# Patient Record
Sex: Male | Born: 1990 | Race: White | Hispanic: No | Marital: Single | Smoking: Current some day smoker
Health system: Southern US, Community
[De-identification: ages and names within clinical notes are randomized; demographics above are authoritative.]

---
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
## 2014-06-29 ENCOUNTER — Ambulatory Visit
Admission: RE | Admit: 2014-06-29 | Discharge: 2014-06-29 | Disposition: A | Discharge status: Home / Self Care | Payer: BLUE CROSS/BLUE SHIELD | Source: Ambulatory Visit | Attending: Nurse Practitioner | Admitting: Nurse Practitioner

## 2014-06-29 ENCOUNTER — Other Ambulatory Visit: Payer: Self-pay | Admitting: Nurse Practitioner

## 2014-06-29 DIAGNOSIS — M545 Low back pain: Secondary | ICD-10-CM

## 2018-11-15 ENCOUNTER — Other Ambulatory Visit: Payer: Self-pay

## 2018-11-15 ENCOUNTER — Inpatient Hospital Stay: Payer: BC Managed Care – PPO | Admitting: Anesthesiology

## 2018-11-15 ENCOUNTER — Inpatient Hospital Stay
Admission: EM | Admit: 2018-11-15 | Discharge: 2018-11-17 | DRG: 137 | Disposition: A | Discharge status: Home / Self Care | Payer: BC Managed Care – PPO | Attending: Internal Medicine | Admitting: Internal Medicine

## 2018-11-15 ENCOUNTER — Emergency Department: Payer: BC Managed Care – PPO

## 2018-11-15 ENCOUNTER — Encounter: Payer: Self-pay | Admitting: Radiology

## 2018-11-15 ENCOUNTER — Encounter
Admission: EM | Disposition: A | Discharge status: Home / Self Care | Payer: Self-pay | Source: Home / Self Care | Attending: Internal Medicine

## 2018-11-15 DIAGNOSIS — F17210 Nicotine dependence, cigarettes, uncomplicated: Secondary | ICD-10-CM | POA: Diagnosis present

## 2018-11-15 DIAGNOSIS — R220 Localized swelling, mass and lump, head: Secondary | ICD-10-CM

## 2018-11-15 DIAGNOSIS — K122 Cellulitis and abscess of mouth: Secondary | ICD-10-CM | POA: Diagnosis present

## 2018-11-15 DIAGNOSIS — K029 Dental caries, unspecified: Secondary | ICD-10-CM | POA: Diagnosis present

## 2018-11-15 DIAGNOSIS — Z20828 Contact with and (suspected) exposure to other viral communicable diseases: Secondary | ICD-10-CM | POA: Diagnosis present

## 2018-11-15 DIAGNOSIS — R739 Hyperglycemia, unspecified: Secondary | ICD-10-CM | POA: Diagnosis not present

## 2018-11-15 DIAGNOSIS — T380X5A Adverse effect of glucocorticoids and synthetic analogues, initial encounter: Secondary | ICD-10-CM | POA: Diagnosis not present

## 2018-11-15 DIAGNOSIS — R030 Elevated blood-pressure reading, without diagnosis of hypertension: Secondary | ICD-10-CM | POA: Diagnosis present

## 2018-11-15 DIAGNOSIS — K047 Periapical abscess without sinus: Principal | ICD-10-CM | POA: Diagnosis present

## 2018-11-15 HISTORY — PX: INCISION AND DRAINAGE ABSCESS: SHX5864

## 2018-11-15 LAB — COMPREHENSIVE METABOLIC PANEL
ALT: 13 U/L (ref 0–44)
AST: 14 U/L — ABNORMAL LOW (ref 15–41)
Albumin: 4.2 g/dL (ref 3.5–5.0)
Alkaline Phosphatase: 57 U/L (ref 38–126)
Anion gap: 14 (ref 5–15)
BUN: 14 mg/dL (ref 6–20)
CO2: 22 mmol/L (ref 22–32)
Calcium: 9.3 mg/dL (ref 8.9–10.3)
Chloride: 100 mmol/L (ref 98–111)
Creatinine, Ser: 0.96 mg/dL (ref 0.61–1.24)
GFR calc Af Amer: >60 mL/min (ref >60)
GFR calc non Af Amer: >60 mL/min (ref >60)
Glucose, Bld: 99 mg/dL (ref 70–99)
Potassium: 4 mmol/L (ref 3.5–5.1)
Sodium: 136 mmol/L (ref 135–145)
Total Bilirubin: 0.9 mg/dL (ref 0.3–1.2)
Total Protein: 8.4 g/dL — ABNORMAL HIGH (ref 6.5–8.1)

## 2018-11-15 LAB — CBC WITH DIFFERENTIAL/PLATELET
Abs Immature Granulocytes: 0.03 10*3/uL (ref 0.00–0.07)
Basophils Absolute: 0.1 10*3/uL (ref 0.0–0.1)
Basophils Relative: 1 %
Eosinophils Absolute: 0.2 10*3/uL (ref 0.0–0.5)
Eosinophils Relative: 1 %
HCT: 40.6 % (ref 39.0–52.0)
Hemoglobin: 13.9 g/dL (ref 13.0–17.0)
Immature Granulocytes: 0 %
Lymphocytes Relative: 11 %
Lymphs Abs: 1.2 10*3/uL (ref 0.7–4.0)
MCH: 31.4 pg (ref 26.0–34.0)
MCHC: 34.2 g/dL (ref 30.0–36.0)
MCV: 91.6 fL (ref 80.0–100.0)
Monocytes Absolute: 1.5 10*3/uL — ABNORMAL HIGH (ref 0.1–1.0)
Monocytes Relative: 13 %
Neutro Abs: 8.2 10*3/uL — ABNORMAL HIGH (ref 1.7–7.7)
Neutrophils Relative %: 74 %
Platelets: 341 10*3/uL (ref 150–400)
RBC: 4.43 MIL/uL (ref 4.22–5.81)
RDW: 11.9 % (ref 11.5–15.5)
WBC: 11.2 10*3/uL — ABNORMAL HIGH (ref 4.0–10.5)
nRBC: 0 % (ref 0.0–0.2)

## 2018-11-15 LAB — TYPE AND SCREEN
ABO/RH(D): O POS
Antibody Screen: NEGATIVE

## 2018-11-15 LAB — LACTIC ACID, PLASMA: Lactic Acid, Venous: 0.8 mmol/L (ref 0.5–1.9)

## 2018-11-15 LAB — SARS CORONAVIRUS 2 BY RT PCR (HOSPITAL ORDER, PERFORMED IN ~~LOC~~ HOSPITAL LAB): SARS Coronavirus 2: NEGATIVE

## 2018-11-15 SURGERY — INCISION AND DRAINAGE, ABSCESS
Anesthesia: General

## 2018-11-15 MED ORDER — PROPOFOL 10 MG/ML IV BOLUS
INTRAVENOUS | Status: AC
  Filled 2018-11-15: qty 20

## 2018-11-15 MED ORDER — LIDOCAINE HCL (PF) 2 % IJ SOLN
INTRAMUSCULAR | Status: AC
  Filled 2018-11-15: qty 10

## 2018-11-15 MED ORDER — KETOROLAC TROMETHAMINE 30 MG/ML IJ SOLN: 30.0000 mg | Freq: Four times a day (QID) | INTRAMUSCULAR | Status: DC | PRN

## 2018-11-15 MED ORDER — ONDANSETRON HCL 4 MG/2ML IJ SOLN
4.0000 mg | Freq: Once | INTRAMUSCULAR | Status: AC
  Administered 2018-11-15: 22:00:00 4 mg via INTRAVENOUS
  Filled 2018-11-15: qty 2

## 2018-11-15 MED ORDER — ROCURONIUM BROMIDE 50 MG/5ML IV SOLN
INTRAVENOUS | Status: AC
  Filled 2018-11-15: qty 1

## 2018-11-15 MED ORDER — LIDOCAINE HCL (CARDIAC) PF 100 MG/5ML IV SOSY
PREFILLED_SYRINGE | INTRAVENOUS | Status: DC | PRN
  Administered 2018-11-15: 100 mg via INTRATRACHEAL

## 2018-11-15 MED ORDER — DEXAMETHASONE SODIUM PHOSPHATE 10 MG/ML IJ SOLN
10.0000 mg | Freq: Once | INTRAMUSCULAR | Status: AC
  Administered 2018-11-15: 10 mg via INTRAVENOUS
  Filled 2018-11-15: qty 1

## 2018-11-15 MED ORDER — MIDAZOLAM HCL 2 MG/2ML IJ SOLN
INTRAMUSCULAR | Status: AC
  Filled 2018-11-15: qty 2

## 2018-11-15 MED ORDER — FENTANYL CITRATE (PF) 100 MCG/2ML IJ SOLN
INTRAMUSCULAR | Status: AC
  Filled 2018-11-15: qty 2

## 2018-11-15 MED ORDER — SODIUM CHLORIDE 0.9 % IV SOLN
INTRAVENOUS | Status: DC
  Administered 2018-11-16 – 2018-11-17 (×3): via INTRAVENOUS

## 2018-11-15 MED ORDER — LACTATED RINGERS IV SOLN
INTRAVENOUS | Status: DC | PRN
  Administered 2018-11-15: 23:00:00 via INTRAVENOUS

## 2018-11-15 MED ORDER — CLINDAMYCIN PHOSPHATE 900 MG/50ML IV SOLN
900.0000 mg | Freq: Once | INTRAVENOUS | Status: AC
  Administered 2018-11-15: 900 mg via INTRAVENOUS
  Filled 2018-11-15: qty 50

## 2018-11-15 MED ORDER — DEXAMETHASONE SODIUM PHOSPHATE 10 MG/ML IJ SOLN
INTRAMUSCULAR | Status: AC
  Filled 2018-11-15: qty 1

## 2018-11-15 MED ORDER — MIDAZOLAM HCL 2 MG/2ML IJ SOLN
INTRAMUSCULAR | Status: DC | PRN
  Administered 2018-11-15: 2 mg via INTRAVENOUS

## 2018-11-15 MED ORDER — DEXAMETHASONE SODIUM PHOSPHATE 10 MG/ML IJ SOLN
INTRAMUSCULAR | Status: DC | PRN
  Administered 2018-11-15: 10 mg via INTRAVENOUS

## 2018-11-15 MED ORDER — ACETAMINOPHEN 650 MG RE SUPP: 650.0000 mg | Freq: Four times a day (QID) | RECTAL | Status: DC | PRN

## 2018-11-15 MED ORDER — FENTANYL CITRATE (PF) 100 MCG/2ML IJ SOLN
INTRAMUSCULAR | Status: DC | PRN
  Administered 2018-11-15 – 2018-11-16 (×2): 50 ug via INTRAVENOUS

## 2018-11-15 MED ORDER — ONDANSETRON HCL 4 MG/2ML IJ SOLN
INTRAMUSCULAR | Status: DC | PRN
  Administered 2018-11-15: 4 mg via INTRAVENOUS

## 2018-11-15 MED ORDER — ONDANSETRON HCL 4 MG/2ML IJ SOLN
INTRAMUSCULAR | Status: AC
  Filled 2018-11-15: qty 2

## 2018-11-15 MED ORDER — SUCCINYLCHOLINE CHLORIDE 20 MG/ML IJ SOLN
INTRAMUSCULAR | Status: AC
  Filled 2018-11-15: qty 1

## 2018-11-15 MED ORDER — ACETAMINOPHEN 325 MG PO TABS
650.0000 mg | ORAL_TABLET | Freq: Four times a day (QID) | ORAL | Status: DC | PRN
  Administered 2018-11-17: 650 mg via ORAL
  Filled 2018-11-15: qty 2

## 2018-11-15 MED ORDER — SODIUM CHLORIDE 0.9 % IV BOLUS
1000.0000 mL | Freq: Once | INTRAVENOUS | Status: AC
  Administered 2018-11-15: 1000 mL via INTRAVENOUS

## 2018-11-15 MED ORDER — PROPOFOL 10 MG/ML IV BOLUS
INTRAVENOUS | Status: DC | PRN
  Administered 2018-11-15: 50 mg via INTRAVENOUS
  Administered 2018-11-15: 150 mg via INTRAVENOUS

## 2018-11-15 MED ORDER — MORPHINE SULFATE (PF) 4 MG/ML IV SOLN
6.0000 mg | Freq: Once | INTRAVENOUS | Status: AC
  Administered 2018-11-15: 22:00:00 6 mg via INTRAVENOUS
  Filled 2018-11-15: qty 2

## 2018-11-15 MED ORDER — SUCCINYLCHOLINE CHLORIDE 20 MG/ML IJ SOLN
INTRAMUSCULAR | Status: DC | PRN
  Administered 2018-11-15: 100 mg via INTRAVENOUS

## 2018-11-15 MED ORDER — IOHEXOL 300 MG/ML  SOLN
75.0000 mL | Freq: Once | INTRAMUSCULAR | Status: AC | PRN
  Administered 2018-11-15: 20:00:00 75 mL via INTRAVENOUS

## 2018-11-15 SURGICAL SUPPLY — 9 items
CANISTER SUCT 1200ML W/VALVE (MISCELLANEOUS) ×3 IMPLANT
COVER WAND RF STERILE (DRAPES) ×3 IMPLANT
ELECT REM PT RETURN 9FT ADLT (ELECTROSURGICAL) ×3
ELECTRODE REM PT RTRN 9FT ADLT (ELECTROSURGICAL) ×1 IMPLANT
GLOVE BIO SURGEON STRL SZ7.5 (GLOVE) ×3 IMPLANT
GOWN STRL REUS W/ TWL LRG LVL3 (GOWN DISPOSABLE) ×2 IMPLANT
GOWN STRL REUS W/TWL LRG LVL3 (GOWN DISPOSABLE) ×4
NS IRRIG 500ML POUR BTL (IV SOLUTION) ×3 IMPLANT
PACK HEAD/NECK (MISCELLANEOUS) ×3 IMPLANT

## 2018-11-15 NOTE — Anesthesia Procedure Notes (Signed)
Procedure Name: Intubation Date/Time: 11/15/2018 11:32 PM Performed by: Clinton Sawyer, CRNA Pre-anesthesia Checklist: Patient identified, Emergency Drugs available, Suction available, Patient being monitored and Timeout performed Patient Re-evaluated:Patient Re-evaluated prior to induction Oxygen Delivery Method: Circle system utilized Preoxygenation: Pre-oxygenation with 100% oxygen Induction Type: IV induction Laryngoscope Size: McGraph and 4 Grade View: Grade I Tube type: Oral Tube size: 7.0 mm Number of attempts: 1 Airway Equipment and Method: Stylet and Video-laryngoscopy Placement Confirmation: ETT inserted through vocal cords under direct vision,  positive ETCO2 and breath sounds checked- equal and bilateral Secured at: 22 cm Tube secured with: Tape Dental Injury: Teeth and Oropharynx as per pre-operative assessment

## 2018-11-15 NOTE — ED Provider Notes (Addendum)
[facility] Emergency Department Provider Note  ____________________________________________   First MD Initiated Contact with Patient 11/15/18 2131     (approximate)  I have reviewed the triage vital signs and the nursing notes.   HISTORY  Chief Complaint Facial Swelling    HPI Trevor Tucker is a 28 y.o. male here with sublingual pain and swelling.  The patient states that over the last 24 to 48 hours, he is developed progressively worsening right facial and dental pain.  He states his symptoms started actually 3 days ago with mild right lower molar pain.  This then resolved.  He felt fine per day.  Over the last 24 hours, his pain has returned and he is developed severe worsening pain, fever, chills, and difficulty swallowing.  He is having difficulty tolerating his secretions.  Denies any shortness of breath.  States he has exquisite pain that is worse with any kind movement of his jaw or palpation.  No leaving factors.  No drainage.  He has poor dentition, and admits to not seeing dentist in years.  No other complaints.  He is not immunosuppressed.        History reviewed. No pertinent past medical history.  Patient Active Problem List   Diagnosis Date Noted  . Sublingual abscess 11/15/2018      Prior to Admission medications   Medication Sig Start Date End Date Taking? Authorizing Provider  clindamycin (CLEOCIN) 300 MG capsule Take 1 capsule (300 mg total) by mouth 4 (four) times daily. 11/17/18   Mayo, Allyn Kenner, MD    Allergies Patient has no known allergies.  History reviewed. No pertinent family history.  Social History Social History   Tobacco Use  . Smoking status: Current Some Day Smoker    Types: Cigarettes  . Smokeless tobacco: Current User    Types: Snuff, Chew  Substance Use Topics  . Alcohol use: Not on file  . Drug use: Not on file    Review of Systems  Review of Systems  Constitutional: Positive for fatigue.  Negative for chills and fever.  HENT: Positive for dental problem and sore throat.   Respiratory: Negative for shortness of breath.   Cardiovascular: Negative for chest pain.  Gastrointestinal: Negative for abdominal pain.  Genitourinary: Negative for flank pain.  Musculoskeletal: Negative for neck pain.  Skin: Negative for rash and wound.  Allergic/Immunologic: Negative for immunocompromised state.  Neurological: Positive for weakness. Negative for numbness.  Hematological: Does not bruise/bleed easily.  All other systems reviewed and are negative.    ____________________________________________  PHYSICAL EXAM:      VITAL SIGNS: ED Triage Vitals  Enc Vitals Group     BP 11/15/18 1851 (!) 146/83     Pulse Rate 11/15/18 1851 89     Resp 11/15/18 1851 18     Temp 11/15/18 1851 (!) 100.4 F (38 C)     Temp Source 11/15/18 1851 Oral     SpO2 11/15/18 1851 97 %     Weight 11/15/18 1851 153 lb (69.4 kg)     Height 11/15/18 1851 6\' 1"  (1.854 m)     Head Circumference --      Peak Flow --      Pain Score 11/15/18 1856 7     Pain Loc --      Pain Edu? --      Excl. in GC? --      Physical Exam Vitals signs and nursing note reviewed.  Constitutional:  General: He is not in acute distress.    Appearance: He is well-developed.  HENT:     Head: Normocephalic and atraumatic.     Comments: Diffuse poor dentition. Marked edema and swelling with tenderness R mandibular molar, with swelling extending to face. No sublingual swelling, no induration, no woody edema.  Eyes:     Conjunctiva/sclera: Conjunctivae normal.  Neck:     Musculoskeletal: Neck supple.     Comments: No stridor. Cardiovascular:     Rate and Rhythm: Normal rate and regular rhythm.     Heart sounds: Normal heart sounds. No murmur. No friction rub.  Pulmonary:     Effort: Pulmonary effort is normal. No respiratory distress.     Breath sounds: Normal breath sounds. No wheezing or rales.  Abdominal:      General: There is no distension.     Palpations: Abdomen is soft.     Tenderness: There is no abdominal tenderness.  Skin:    General: Skin is warm.     Capillary Refill: Capillary refill takes less than 2 seconds.  Neurological:     Mental Status: He is alert and oriented to person, place, and time.     Motor: No abnormal muscle tone.       ____________________________________________   LABS (all labs ordered are listed, but only abnormal results are displayed)  Labs Reviewed  CBC WITH DIFFERENTIAL/PLATELET - Abnormal; Notable for the following components:      Result Value   WBC 11.2 (*)    Neutro Abs 8.2 (*)    Monocytes Absolute 1.5 (*)    All other components within normal limits  COMPREHENSIVE METABOLIC PANEL - Abnormal; Notable for the following components:   Total Protein 8.4 (*)    AST 14 (*)    All other components within normal limits  CBC - Abnormal; Notable for the following components:   RBC 4.17 (*)    HCT 38.5 (*)    All other components within normal limits  BASIC METABOLIC PANEL - Abnormal; Notable for the following components:   Glucose, Bld 158 (*)    All other components within normal limits  CBC - Abnormal; Notable for the following components:   WBC 12.0 (*)    RBC 4.00 (*)    Hemoglobin 12.3 (*)    HCT 36.5 (*)    All other components within normal limits  BASIC METABOLIC PANEL - Abnormal; Notable for the following components:   Glucose, Bld 127 (*)    All other components within normal limits  SARS CORONAVIRUS 2 (HOSPITAL ORDER, PERFORMED IN [facility])  LACTIC ACID, PLASMA  HIV ANTIBODY (ROUTINE TESTING W REFLEX)  TYPE AND SCREEN    ____________________________________________  EKG:  ________________________________________  RADIOLOGY All imaging, including plain films, CT scans, and ultrasounds, independently reviewed by me, and interpretations confirmed via formal radiology reads.  ED MD interpretation:   CT Neck:  Infected molar w/ sublingual abscess and submandibular infection  Official radiology report(s): No results found.  ____________________________________________  PROCEDURES   Procedure(s) performed (including Critical Care):  Procedures  ____________________________________________  INITIAL IMPRESSION / MDM / ASSESSMENT AND PLAN / ED COURSE  As part of my medical decision making, I reviewed the following data within the electronic MEDICAL RECORD NUMBER Notes from prior ED visits and  Controlled Substance Database      *Trevor Tucker was evaluated in Emergency Department on 12/01/2018 for the symptoms described in the history of present illness. He was  evaluated in the context of the global COVID-19 pandemic, which necessitated consideration that the patient might be at risk for infection with the SARS-CoV-2 virus that causes COVID-19. Institutional protocols and algorithms that pertain to the evaluation of patients at risk for COVID-19 are in a state of rapid change based on information released by regulatory bodies including the CDC and federal and state organizations. These policies and algorithms were followed during the patient's care in the ED.  Some ED evaluations and interventions may be delayed as a result of limited staffing during the pandemic.*      Medical Decision Making: 28 year old male here with right molar infection extending to the sublingual space with large abscess. Febrile here. IV Clinda, decadron given. Dr. Richardson Landry consulted, will go to OR for I&D. Appreciate his evaluation. Will admit to medicine. Airway intact, no signs of compromise at this time.   ____________________________________________  FINAL CLINICAL IMPRESSION(S) / ED DIAGNOSES  Final diagnoses:  Dental abscess  Facial swelling     MEDICATIONS GIVEN DURING THIS VISIT:  Medications  iohexol (OMNIPAQUE) 300 MG/ML solution 75 mL (75 mLs Intravenous Contrast Given 11/15/18 2006)  clindamycin  (CLEOCIN) IVPB 900 mg (0 mg Intravenous Stopped 11/15/18 2224)  sodium chloride 0.9 % bolus 1,000 mL (1,000 mLs Intravenous New Bag/Given 11/15/18 2144)  dexamethasone (DECADRON) injection 10 mg (10 mg Intravenous Given 11/15/18 2146)  morphine 4 MG/ML injection 6 mg (6 mg Intravenous Given 11/15/18 2149)  ondansetron (ZOFRAN) injection 4 mg (4 mg Intravenous Given 11/15/18 2147)  dexamethasone (DECADRON) injection 4 mg (4 mg Intravenous Given 11/16/18 2104)  fentaNYL (SUBLIMAZE) 100 MCG/2ML injection (has no administration in time range)  midazolam (VERSED) 2 MG/2ML injection (has no administration in time range)  propofol (DIPRIVAN) 10 mg/mL bolus/IV push (has no administration in time range)  succinylcholine (ANECTINE) 20 MG/ML injection (has no administration in time range)  lidocaine (XYLOCAINE) 2 % injection (has no administration in time range)  rocuronium (ZEMURON) 50 MG/5ML injection (has no administration in time range)  dexamethasone (DECADRON) 10 MG/ML injection (has no administration in time range)  ondansetron (ZOFRAN) 4 MG/2ML injection (has no administration in time range)     ED Discharge Orders         Ordered    clindamycin (CLEOCIN) 300 MG capsule  4 times daily     11/17/18 1012    Increase activity slowly     11/17/18 1013    Diet - low sodium heart healthy     11/17/18 1013           Note:  This document was prepared using Dragon voice recognition software and may include unintentional dictation errors.   Duffy Bruce, MD 11/15/18 5621    Duffy Bruce, MD 12/01/18 [phone]

## 2018-11-15 NOTE — Consult Note (Signed)
Trevor Tucker, Trevor Tucker [id number] [birth year] Sandi Mealy, MD  Reason for Consult: Dental abscess Requesting Physician: Shaune Pollack, MD Consulting Physician: Sandi Mealy, MD  HPI: This 28 y.o. year old male was admitted on 11/15/2018 for Abcess. In the right neck/sublingual region from an infected tooth with progressive swelling, fever, difficulty swallowing since Thursday. CT shows a 3 cm abscess. No difficulty breathing.  Medications: Tylenol, Ibuprofen prn No current facility-administered medications for this encounter.    No current outpatient medications on file.  . (Not in a hospital admission)   Allergies: No Known Allergies  PMH: History reviewed. No pertinent past medical history.  Fam Hx: No family history on file.  Soc Hx:  Social History   Socioeconomic History  . Marital status: Single    Spouse name: Not on file  . Number of children: Not on file  . Years of education: Not on file  . Highest education level: Not on file  Occupational History  . Not on file  Social Needs  . Financial resource strain: Not on file  . Food insecurity    Worry: Not on file    Inability: Not on file  . Transportation needs    Medical: Not on file    Non-medical: Not on file  Tobacco Use  . Smoking status: Not on file  Substance and Sexual Activity  . Alcohol use: Not on file  . Drug use: Not on file  . Sexual activity: Not on file  Lifestyle  . Physical activity    Days per week: Not on file    Minutes per session: Not on file  . Stress: Not on file  Relationships  . Social Musician on phone: Not on file    Gets together: Not on file    Attends religious service: Not on file    Active member of club or organization: Not on file    Attends meetings of clubs or organizations: Not on file    Relationship status: Not on file  . Intimate partner violence    Fear of current or ex partner: Not on file    Emotionally abused: Not on file    Physically  abused: Not on file    Forced sexual activity: Not on file  Other Topics Concern  . Not on file  Social History Narrative  . Not on file    PSH: None. Procedures since admission: No admission procedures for hospital encounter.  ROS: Review of systems normal other than 12 systems except per HPI. No exposure to COVID known. Minor cough from allergies.   PHYSICAL EXAM  Vitals: Blood pressure (!) 146/83, pulse 89, temperature (!) 100.4 F (38 C), temperature source Oral, resp. rate 18, height 6\' 1"  (1.854 m), weight 69.4 kg, SpO2 97 %.. General: Well-developed, Well-nourished in no acute distress Mood: Mood and affect well adjusted, pleasant and cooperative. Orientation: Grossly alert and oriented. Vocal Quality: No hoarseness. Communicates verbally. head and Face: NCAT. No facial asymmetry. No visible skin lesions. No significant facial scars. No tenderness with sinus percussion. Facial strength normal and symmetric. Ears: External ears with normal landmarks, no lesions. External auditory canals free of infection, cerumen impaction or lesions. Tympanic membranes intact with good landmarks and normal mobility on pneumatic otoscopy. No middle ear effusion. Hearing: Speech reception grossly normal. Nose: External nose normal with midline dorsum and no lesions or deformity. Nasal Cavity reveals essentially midline septum with normal inferior turbinates. No significant mucosal congestion or erythema. Nasal  secretions are minimal and clear. No polyps seen on anterior rhinoscopy. Oral Cavity/ Oropharynx: Lips are normal with no lesions. Teeth possible dental caries right. Swelling moderate of right FOM. Moderate Trismus. Posterior pharynx clear.  Indirect Laryngoscopy/Nasopharyngoscopy: Visualization of the larynx, hypopharynx and nasopharynx is not possible in this setting with routine examination. Neck: Supple with firm edema right submandibular region, tender to palpation Lymphatic: Cervical lymph  nodes swollen tender right neck, indiscernable from the submandibular gland on the right Respiratory: Normal respiratory effort without labored breathing. Cardiovascular: Carotid pulse shows regular rate and rhythm Neurologic: Cranial Nerves II through XII are grossly intact. Eyes: Gaze and Ocular Motility are grossly normal. PERRLA. No visible nystagmus.  MEDICAL DECISION MAKING: Data Review:  Results for orders placed or performed during the hospital encounter of 11/15/18 (from the past 48 hour(s))  CBC with Differential     Status: Abnormal   Collection Time: 11/15/18  7:04 PM  Result Value Ref Range   WBC 11.2 (H) 4.0 - 10.5 K/uL   RBC 4.43 4.22 - 5.81 MIL/uL   Hemoglobin 13.9 13.0 - 17.0 g/dL   HCT 40.6 39.0 - 52.0 %   MCV 91.6 80.0 - 100.0 fL   MCH 31.4 26.0 - 34.0 pg   MCHC 34.2 30.0 - 36.0 g/dL   RDW 11.9 11.5 - 15.5 %   Platelets 341 150 - 400 K/uL   nRBC 0.0 0.0 - 0.2 %   Neutrophils Relative % 74 %   Neutro Abs 8.2 (H) 1.7 - 7.7 K/uL   Lymphocytes Relative 11 %   Lymphs Abs 1.2 0.7 - 4.0 K/uL   Monocytes Relative 13 %   Monocytes Absolute 1.5 (H) 0.1 - 1.0 K/uL   Eosinophils Relative 1 %   Eosinophils Absolute 0.2 0.0 - 0.5 K/uL   Basophils Relative 1 %   Basophils Absolute 0.1 0.0 - 0.1 K/uL   Immature Granulocytes 0 %   Abs Immature Granulocytes 0.03 0.00 - 0.07 K/uL    Comment: Performed at [facility], [address] [place], [place] [address]  Comprehensive metabolic panel     Status: Abnormal   Collection Time: 11/15/18  7:04 PM  Result Value Ref Range   Sodium 136 135 - 145 mmol/L   Potassium 4.0 3.5 - 5.1 mmol/L   Chloride 100 98 - 111 mmol/L   CO2 22 22 - 32 mmol/L   Glucose, Bld 99 70 - 99 mg/dL   BUN 14 6 - 20 mg/dL   Creatinine, Ser 0.96 0.61 - 1.24 mg/dL   Calcium 9.3 8.9 - 10.3 mg/dL   Total Protein 8.4 (H) 6.5 - 8.1 g/dL   Albumin 4.2 3.5 - 5.0 g/dL   AST 14 (L) 15 - 41 U/L   ALT 13 0 - 44 U/L   Alkaline Phosphatase 57 38 - 126  U/L   Total Bilirubin 0.9 0.3 - 1.2 mg/dL   GFR calc non Af Amer >60 >60 mL/min   GFR calc Af Amer >60 >60 mL/min   Anion gap 14 5 - 15    Comment: Performed at [facility], [address] [place], [place] [address]  Lactic acid, plasma     Status: None   Collection Time: 11/15/18  7:04 PM  Result Value Ref Range   Lactic Acid, Venous 0.8 0.5 - 1.9 mmol/L    Comment: Performed at [facility], [address] [place], [place] [address]  Type and screen [facility]  Status: None (Preliminary result)   Collection Time: 11/15/18  9:56 PM  Result Value Ref Range   ABO/RH(D) PENDING    Antibody Screen PENDING    Sample Expiration      11/18/2018,2359 Performed at [facility], [address] [place], [place] [id number]   . Ct Soft Tissue Neck W Contrast  Result Date: 11/15/2018 CLINICAL DATA:  Right-sided jaw pain and swelling beginning 4 days ago. EXAM: CT NECK WITH CONTRAST TECHNIQUE: Multidetector CT imaging of the neck was performed using the standard protocol following the bolus administration of intravenous contrast. CONTRAST:  75mL OMNIPAQUE IOHEXOL 300 MG/ML  SOLN COMPARISON:  None. FINDINGS: Pharynx and larynx: No mucosal lesion is seen. There is marked swelling in the region of the right mandibular gingiva and the right sublingual space with a sublingual abscess measuring 3 cm in diameter. Salivary glands: Parotid and submandibular glands are normal. Thyroid: Normal Lymph nodes: Reactive nodal enlargement of the submandibular and level 2 nodes. No suppuration. Vascular: Normal Limited intracranial: Normal Visualized orbits: Normal Mastoids and visualized paranasal sinuses: Some opacification of the anterior ethmoid and frontal sinuses. Not completely imaged. Skeleton: Dental decay a and periodontal disease at teeth 1, 16 and 32. Disease at tooth 32 includes a fairly sizable periapical cyst and is probably the etiology of the right  maxillary gingival and sublingual inflammatory disease. Upper chest: Negative Other: None IMPRESSION: Dental and periodontal disease teeth 1, 16 and 32. Periapical abscess of the right mandibular wisdom tooth 32 probably responsible for pronounced right face inflammatory change with right gingival and sublingual abscess measuring up to 3 cm in diameter. Reactive regional nodal enlargement without suppuration. Electronically Signed   By: Paulina FusiMark  Shogry M.D.   On: 11/15/2018 20:27  .   ASSESSMENT: Right sublingual abscess due to dental infection  PLAN: I&D of abscess with post-op admission to medicine for IV antibiotics. Have discussed with the patient that he will still need the offending tooth extracted, as I am not trained for dental procedures. Risks discussed including potential for airway complications requiring emergent tracheostomy   Sandi MealyPaul S Kynisha Memon, MD 11/15/2018 10:33 PM

## 2018-11-15 NOTE — ED Notes (Signed)
Pt states no trouble breathing but difficulty swallowing

## 2018-11-15 NOTE — Anesthesia Preprocedure Evaluation (Signed)
Anesthesia Evaluation  Patient identified by MRN, date of birth, ID band Patient awake    Reviewed: Allergy & Precautions, NPO status , Patient's Chart, lab work & pertinent test results, reviewed documented beta blocker date and time   Airway Mallampati: II  TM Distance: >3 FB     Dental  (+) Chipped   Pulmonary           Cardiovascular      Neuro/Psych    GI/Hepatic   Endo/Other    Renal/GU      Musculoskeletal   Abdominal   Peds  Hematology   Anesthesia Other Findings Dental, sublingual abscess.  Reproductive/Obstetrics                             Anesthesia Physical Anesthesia Plan  ASA: III  Anesthesia Plan: General   Post-op Pain Management:    Induction: Intravenous  PONV Risk Score and Plan:   Airway Management Planned: Oral ETT  Additional Equipment:   Intra-op Plan:   Post-operative Plan:   Informed Consent: I have reviewed the patients History and Physical, chart, labs and discussed the procedure including the risks, benefits and alternatives for the proposed anesthesia with the patient or authorized representative who has indicated his/her understanding and acceptance.       Plan Discussed with: CRNA  Anesthesia Plan Comments:         Anesthesia Quick Evaluation

## 2018-11-15 NOTE — ED Notes (Addendum)
Wrong chart

## 2018-11-15 NOTE — ED Triage Notes (Signed)
Pt with right sided jaw swelling since Thursday. Pt started amxoicillin on Friday for same. Pt with noted swelling to right jaw. Pt with low grade fever. Pt denies tylenol or ibuprofen since 1500. Airway patent.

## 2018-11-15 NOTE — ED Notes (Signed)
Rosann Auerbach mom: [phone]

## 2018-11-15 NOTE — H&P (Addendum)
[facility] [place] [facility]   PATIENT NAME: Trevor Tucker    MR#:  [id number]  DATE OF BIRTH:  [birth year]  DATE OF ADMISSION:  11/15/2018  PRIMARY CARE PHYSICIAN: Patient, No Pcp Per   REQUESTING/REFERRING PHYSICIAN: Shaune PollackIsaacs, Cameron, MD  CHIEF COMPLAINT:   Chief Complaint  Patient presents with   Facial Swelling    HISTORY OF PRESENT ILLNESS:  28 y.o. male with no pertinent past medical presenting to the ED with chief complaints of right submandibular/sublingual pain and swelling.  Patient reports onset of symptoms since 11/10/2018 initially with tooth ache in and around his right back teeth and jaw.  Patient stated that the tooth ache resolved however on Thursday he woke up with swelling and pain around his right submandibular area and difficulty swallowing.  Patient states he had difficulty opening his mouth, chewing or eating.  Denies associated symptoms of fevers or chills, drooling or other associated symptoms.  Had been initially seen by his PCP for symptoms with a prescription of oral antibiotics (amoxicillin 4 times daily close)and oral nonsteroidal anti-inflammatory drugs (ibuprofen).  Patient stated he had difficulty swallowing medications.  Following limited response to the initial medicated treatment, patient decided to come to the ED for further evaluation.  On arrival to the ED, he was afebrile with blood pressure 224/114 mm Hg and pulse rate 81 beats/min.  He was found to be conscious with no airway compromise, but with asthenia, dysphagia, moderate to severe trismus and sub-maxillary adenopathies.  There were no associated inspiratory stridor.  Patient showed marked facial asymmetry, with painful indurated tumefaction in the right submandibular region, without clear boundaries.  Patient labs revealed WBC of 11.2 otherwise unremarkable CBC, CMP unremarkable, COVID-19 negative.  CT of the neck was obtained and showed periapical abscess of the  right mandibular wisdom tooth 32 with a probable right face inflammatory changes with right gingival and sublingual abscess.  Patient was started on empiric intravenous antibiotics with clindamycin 600 mg every 8 hours.  ENT consulted and patient was taken to the OR for incision and drainage.  PAST MEDICAL HISTORY:  History reviewed. No pertinent past medical history.  PAST SURGICAL HISTORY:  History reviewed. No pertinent surgical history.  SOCIAL HISTORY:   Social History   Tobacco Use   Smoking status: Current Some Day Smoker    Types: Cigarettes   Smokeless tobacco: Current User    Types: Snuff, Chew  Substance Use Topics   Alcohol use: Not on file    FAMILY HISTORY:  History reviewed. No pertinent family history.  DRUG ALLERGIES:  No Known Allergies  REVIEW OF SYSTEMS:   Review of Systems  Constitutional: Negative for chills, fever, malaise/fatigue and weight loss.  HENT: Positive for sore throat. Negative for congestion and hearing loss.        Dysphagia  Eyes: Negative for blurred vision and double vision.  Respiratory: Negative for cough, shortness of breath and wheezing.   Cardiovascular: Negative for chest pain, palpitations, orthopnea and leg swelling.  Gastrointestinal: Negative for abdominal pain, diarrhea, nausea and vomiting.  Genitourinary: Negative for dysuria and urgency.  Musculoskeletal: Positive for neck pain. Negative for myalgias.  Skin: Negative for rash.  Neurological: Negative for dizziness, sensory change, speech change, focal weakness and headaches.  Psychiatric/Behavioral: Negative for depression.   MEDICATIONS AT HOME:   Prior to Admission medications   Not on File      VITAL SIGNS:  Blood pressure 126/64, pulse 70, temperature 98.6 F (37  C), temperature source Oral, resp. rate 18, height 6\' 1"  (1.854 m), weight 69.4 kg, SpO2 98 %.  PHYSICAL EXAMINATION:   Physical Exam  GENERAL:  28 y.o.-year-old patient lying in the bed  with no acute distress.  EYES: Pupils equal, round, reactive to light and accommodation. No scleral icterus. Extraocular muscles intact.  HEENT: Head atraumatic, normocephalic. marked facial asymmetry, with painful indurated tumefaction in the right submandibular region, without clear boundaries. NECK:  Supple, no jugular venous distention. No thyroid enlargement, no tenderness.  LUNGS: Normal breath sounds bilaterally, no wheezing, rales,rhonchi or crepitation. No use of accessory muscles of respiration.  CARDIOVASCULAR: S1, S2 normal. No murmurs, rubs, or gallops.  ABDOMEN: Soft, nontender, nondistended. Bowel sounds present. No organomegaly or mass.  EXTREMITIES: No pedal edema, cyanosis, or clubbing.  NEUROLOGIC: Cranial nerves II through XII are intact. Muscle strength 5/5 in all extremities. Sensation intact. Gait not checked.  PSYCHIATRIC: The patient is alert and oriented x 3.  SKIN: No obvious rash, lesion, or ulcer.   DATA REVIEWED:  LABORATORY PANEL:   CBC Recent Labs  Lab 11/15/18 1904  WBC 11.2*  HGB 13.9  HCT 40.6  PLT 341   ------------------------------------------------------------------------------------------------------------------  Chemistries  Recent Labs  Lab 11/15/18 1904  NA 136  K 4.0  CL 100  CO2 22  GLUCOSE 99  BUN 14  CREATININE 0.96  CALCIUM 9.3  AST 14*  ALT 13  ALKPHOS 57  BILITOT 0.9   ------------------------------------------------------------------------------------------------------------------  Cardiac Enzymes No results for input(s): TROPONINI in the last 168 hours. ------------------------------------------------------------------------------------------------------------------  RADIOLOGY:  Ct Soft Tissue Neck W Contrast  Result Date: 11/15/2018 CLINICAL DATA:  Right-sided jaw pain and swelling beginning 4 days ago. EXAM: CT NECK WITH CONTRAST TECHNIQUE: Multidetector CT imaging of the neck was performed using the standard  protocol following the bolus administration of intravenous contrast. CONTRAST:  20mL OMNIPAQUE IOHEXOL 300 MG/ML  SOLN COMPARISON:  None. FINDINGS: Pharynx and larynx: No mucosal lesion is seen. There is marked swelling in the region of the right mandibular gingiva and the right sublingual space with a sublingual abscess measuring 3 cm in diameter. Salivary glands: Parotid and submandibular glands are normal. Thyroid: Normal Lymph nodes: Reactive nodal enlargement of the submandibular and level 2 nodes. No suppuration. Vascular: Normal Limited intracranial: Normal Visualized orbits: Normal Mastoids and visualized paranasal sinuses: Some opacification of the anterior ethmoid and frontal sinuses. Not completely imaged. Skeleton: Dental decay a and periodontal disease at teeth 1, 16 and 32. Disease at tooth 32 includes a fairly sizable periapical cyst and is probably the etiology of the right maxillary gingival and sublingual inflammatory disease. Upper chest: Negative Other: None IMPRESSION: Dental and periodontal disease teeth 1, 16 and 32. Periapical abscess of the right mandibular wisdom tooth 32 probably responsible for pronounced right face inflammatory change with right gingival and sublingual abscess measuring up to 3 cm in diameter. Reactive regional nodal enlargement without suppuration. Electronically Signed   By: Nelson Chimes M.D.   On: 11/15/2018 20:27   EKG:  EKG: there are no previous tracings available for comparison.  IMPRESSION AND PLAN:   28 y.o. male with no pertinent past medical presenting to the ED with chief complaints of right submandibular/sublingual pain and swelling.  1. Right sublingual abscess -secondary to dental infection - Status post transoral incision and drainage of sublingual space abscess POD #0 - Admit to MedSurg unit - Continue empiric antibiotics with clindamycin - IV steroids for swelling - PRN pain management - Clear  diet advance as tolerated - We will need  post discharge dental follow-up to address the infected teeth - ENT input appreciated  2. Elevated blood pressure on admission -likely secondary to pain - No history or diagnosis of hypertension - Treat with PRN labetalol  3. DVT prophylaxis - Held anti-coagulation for procedure.  Will need to restart   All the records are reviewed and case discussed with ED provider. Management plans discussed with the patient, family and they are in agreement.  CODE STATUS: FULL  TOTAL TIME TAKING CARE OF THIS PATIENT: 50 minutes.    on 11/16/2018 at 3:41 AM   Trevor Silversmith, DNP, FNP-BC [facility] Nurse Practitioner Between 7am to 6pm - Pager [phone]  After 6pm go to [email or web address] - password [email or web address]  [facility]  Office  [phone]  CC: Primary care physician; Patient, No Pcp Per

## 2018-11-16 ENCOUNTER — Other Ambulatory Visit: Payer: Self-pay | Admitting: Otolaryngology

## 2018-11-16 ENCOUNTER — Encounter: Payer: Self-pay | Admitting: *Deleted

## 2018-11-16 LAB — BASIC METABOLIC PANEL
Anion gap: 10 (ref 5–15)
BUN: 14 mg/dL (ref 6–20)
CO2: 22 mmol/L (ref 22–32)
Calcium: 9.1 mg/dL (ref 8.9–10.3)
Chloride: 104 mmol/L (ref 98–111)
Creatinine, Ser: 0.88 mg/dL (ref 0.61–1.24)
GFR calc Af Amer: >60 mL/min (ref >60)
GFR calc non Af Amer: >60 mL/min (ref >60)
Glucose, Bld: 158 mg/dL — ABNORMAL HIGH (ref 70–99)
Potassium: 4.3 mmol/L (ref 3.5–5.1)
Sodium: 136 mmol/L (ref 135–145)

## 2018-11-16 LAB — CBC
HCT: 38.5 % — ABNORMAL LOW (ref 39.0–52.0)
Hemoglobin: 13.1 g/dL (ref 13.0–17.0)
MCH: 31.4 pg (ref 26.0–34.0)
MCHC: 34 g/dL (ref 30.0–36.0)
MCV: 92.3 fL (ref 80.0–100.0)
Platelets: 311 10*3/uL (ref 150–400)
RBC: 4.17 MIL/uL — ABNORMAL LOW (ref 4.22–5.81)
RDW: 11.9 % (ref 11.5–15.5)
WBC: 8.8 10*3/uL (ref 4.0–10.5)
nRBC: 0 % (ref 0.0–0.2)

## 2018-11-16 MED ORDER — ENOXAPARIN SODIUM 40 MG/0.4ML ~~LOC~~ SOLN: 40.0000 mg | SUBCUTANEOUS | Status: DC

## 2018-11-16 MED ORDER — CLINDAMYCIN PHOSPHATE 600 MG/50ML IV SOLN: 600.0000 mg | Freq: Three times a day (TID) | INTRAVENOUS | Status: DC

## 2018-11-16 MED ORDER — CLINDAMYCIN PHOSPHATE 900 MG/50ML IV SOLN
900.0000 mg | Freq: Three times a day (TID) | INTRAVENOUS | Status: DC
  Administered 2018-11-16 – 2018-11-17 (×5): 900 mg via INTRAVENOUS
  Filled 2018-11-16 (×6): qty 50

## 2018-11-16 MED ORDER — FENTANYL CITRATE (PF) 100 MCG/2ML IJ SOLN: 25.0000 ug | INTRAMUSCULAR | Status: DC | PRN

## 2018-11-16 MED ORDER — ONDANSETRON HCL 4 MG/2ML IJ SOLN: 4.0000 mg | Freq: Once | INTRAMUSCULAR | Status: DC | PRN

## 2018-11-16 MED ORDER — DEXAMETHASONE SODIUM PHOSPHATE 4 MG/ML IJ SOLN
4.0000 mg | Freq: Three times a day (TID) | INTRAMUSCULAR | Status: AC
  Administered 2018-11-16 (×3): 4 mg via INTRAVENOUS
  Filled 2018-11-16 (×3): qty 1

## 2018-11-16 NOTE — Anesthesia Postprocedure Evaluation (Signed)
Anesthesia Post Note  Patient: Trevor Tucker  Procedure(s) Performed: INCISION AND DRAINAGE Of Dental ABSCESS (N/A )  Patient location during evaluation: PACU Anesthesia Type: General Level of consciousness: awake and alert Pain management: pain level controlled Vital Signs Assessment: post-procedure vital signs reviewed and stable Respiratory status: spontaneous breathing, nonlabored ventilation, respiratory function stable and patient connected to nasal cannula oxygen Cardiovascular status: blood pressure returned to baseline and stable Postop Assessment: no apparent nausea or vomiting Anesthetic complications: no     Last Vitals:  Vitals:   11/16/18 0202 11/16/18 0420  BP: 126/64 129/70  Pulse: 70 (!) 53  Resp:  20  Temp: 37 C 36.4 C  SpO2: 98% 99%    Last Pain:  Vitals:   11/16/18 0420  TempSrc: Oral  PainSc:                  [name]

## 2018-11-16 NOTE — Consult Note (Signed)
Trevor Tucker, Trevor Tucker [id number] [birth year] Trevor Nearing, MD   SUBJECTIVE: This 28 y.o. year old male is status post transoral I&D of sublingual abscess secondary to dental infection. Doing much better. Still a little swollen, but able to swallow now and pain is much improved.   Medications:  Current Facility-Administered Medications  Medication Dose Route Frequency Provider Last Rate Last Dose  . 0.9 %  sodium chloride infusion   Intravenous Continuous Lang Snow, NP   Stopped at 11/16/18 [phone]  . acetaminophen (TYLENOL) tablet 650 mg  650 mg Oral Q6H PRN Lang Snow, NP       Or  . acetaminophen (TYLENOL) suppository 650 mg  650 mg Rectal Q6H PRN Lang Snow, NP      . clindamycin (CLEOCIN) IVPB 900 mg  900 mg Intravenous Q8H Lang Snow, NP 100 mL/hr at 11/16/18 [phone]    . dexamethasone (DECADRON) injection 4 mg  4 mg Intravenous TID Lang Snow, NP   4 mg at 11/16/18 0415  . enoxaparin (LOVENOX) injection 40 mg  40 mg Subcutaneous Q24H Mayo, Pete Pelt, MD      . ketorolac (TORADOL) 30 MG/ML injection 30 mg  30 mg Intravenous Q6H PRN Lang Snow, NP      .  Medications Prior to Admission  Medication Sig Dispense Refill  . amoxicillin (AMOXIL) 500 MG tablet Take 500 mg by mouth 2 (two) times daily.      OBJECTIVE:  PHYSICAL EXAM  Vitals: Blood pressure 129/70, pulse (!) 53, temperature 97.6 F (36.4 C), temperature source Oral, resp. rate 20, height 6\' 1"  (1.854 m), weight 69.4 kg, SpO2 99 %.. General: Well-developed, Well-nourished in no acute distress Mood: Mood and affect well adjusted, pleasant and cooperative. Orientation: Grossly alert and oriented. Vocal Quality: No hoarseness. Communicates verbally. head and Face: NCAT. No facial asymmetry. No visible skin lesions. No significant facial scars. No tenderness with sinus percussion. Facial strength normal and symmetric. Oral Cavity/ Oropharynx: Lips are normal  with no lesions. Teeth with poor dentition and dental caries. Gingiva inflamed on right posteriorly from infection. Minimal cloudy discharge from drainage site. Trismus substantially improved. Floor of mouth edema much improved. Neck: Supple with mild tenderness right submandibular region, still with some firmness/edema but no fluctuance Respiratory: Normal respiratory effort without labored breathing.   MEDICAL DECISION MAKING: Data Review:  Results for orders placed or performed during the hospital encounter of 11/15/18 (from the past 48 hour(s))  CBC with Differential     Status: Abnormal   Collection Time: 11/15/18  7:04 PM  Result Value Ref Range   WBC 11.2 (H) 4.0 - 10.5 K/uL   RBC 4.43 4.22 - 5.81 MIL/uL   Hemoglobin 13.9 13.0 - 17.0 g/dL   HCT 40.6 39.0 - 52.0 %   MCV 91.6 80.0 - 100.0 fL   MCH 31.4 26.0 - 34.0 pg   MCHC 34.2 30.0 - 36.0 g/dL   RDW 11.9 11.5 - 15.5 %   Platelets 341 150 - 400 K/uL   nRBC 0.0 0.0 - 0.2 %   Neutrophils Relative % 74 %   Neutro Abs 8.2 (H) 1.7 - 7.7 K/uL   Lymphocytes Relative 11 %   Lymphs Abs 1.2 0.7 - 4.0 K/uL   Monocytes Relative 13 %   Monocytes Absolute 1.5 (H) 0.1 - 1.0 K/uL   Eosinophils Relative 1 %   Eosinophils Absolute 0.2 0.0 - 0.5 K/uL   Basophils Relative 1 %   Basophils  Absolute 0.1 0.0 - 0.1 K/uL   Immature Granulocytes 0 %   Abs Immature Granulocytes 0.03 0.00 - 0.07 K/uL    Comment: Performed at [facility], [address] [place], [place] [address]  Comprehensive metabolic panel     Status: Abnormal   Collection Time: 11/15/18  7:04 PM  Result Value Ref Range   Sodium 136 135 - 145 mmol/L   Potassium 4.0 3.5 - 5.1 mmol/L   Chloride 100 98 - 111 mmol/L   CO2 22 22 - 32 mmol/L   Glucose, Bld 99 70 - 99 mg/dL   BUN 14 6 - 20 mg/dL   Creatinine, Ser 9.620.96 0.61 - 1.24 mg/dL   Calcium 9.3 8.9 - 95.210.3 mg/dL   Total Protein 8.4 (H) 6.5 - 8.1 g/dL   Albumin 4.2 3.5 - 5.0 g/dL   AST 14 (L) 15 - 41 U/L   ALT 13  0 - 44 U/L   Alkaline Phosphatase 57 38 - 126 U/L   Total Bilirubin 0.9 0.3 - 1.2 mg/dL   GFR calc non Af Amer >60 >60 mL/min   GFR calc Af Amer >60 >60 mL/min   Anion gap 14 5 - 15    Comment: Performed at [facility], [address] [place], [place] [address]  Lactic acid, plasma     Status: None   Collection Time: 11/15/18  7:04 PM  Result Value Ref Range   Lactic Acid, Venous 0.8 0.5 - 1.9 mmol/L    Comment: Performed at [facility], [address] [place], [place] [address]  SARS Coronavirus 2 [facility](Hospital order, Performed in [facility] hospital lab) Nasopharyngeal Nasopharyngeal Swab     Status: None   Collection Time: 11/15/18  9:56 PM   Specimen: Nasopharyngeal Swab  Result Value Ref Range   SARS Coronavirus 2 NEGATIVE NEGATIVE    Comment: (NOTE) If result is NEGATIVE SARS-CoV-2 target nucleic acids are NOT DETECTED. The SARS-CoV-2 RNA is generally detectable in upper and lower  respiratory specimens during the acute phase of infection. The lowest  concentration of SARS-CoV-2 viral copies this assay can detect is 250  copies / mL. A negative result does not preclude SARS-CoV-2 infection  and should not be used as the sole basis for treatment or other  patient management decisions.  A negative result may occur with  improper specimen collection / handling, submission of specimen other  than nasopharyngeal swab, presence of viral mutation(s) within the  areas targeted by this assay, and inadequate number of viral copies  (<250 copies / mL). A negative result must be combined with clinical  observations, patient history, and epidemiological information. If result is POSITIVE SARS-CoV-2 target nucleic acids are DETECTED. The SARS-CoV-2 RNA is generally detectable in upper and lower  respiratory specimens dur ing the acute phase of infection.  Positive  results are indicative of active infection with SARS-CoV-2.  Clinical  correlation with patient history  and other diagnostic information is  necessary to determine patient infection status.  Positive results do  not rule out bacterial infection or co-infection with other viruses. If result is PRESUMPTIVE POSTIVE SARS-CoV-2 nucleic acids MAY BE PRESENT.   A presumptive positive result was obtained on the submitted specimen  and confirmed on repeat testing.  While 2019 novel coronavirus  (SARS-CoV-2) nucleic acids may be present in the submitted sample  additional confirmatory testing may be necessary for epidemiological  and / or clinical management purposes  to differentiate between  SARS-CoV-2  and other Sarbecovirus currently known to infect humans.  If clinically indicated additional testing with an alternate test  methodology [phone]) is advised. The SARS-CoV-2 RNA is generally  detectable in upper and lower respiratory sp ecimens during the acute  phase of infection. The expected result is Negative. Fact Sheet for Patients:  BoilerBrush.com.cy Fact Sheet for Healthcare Providers: https://pope.com/ This test is not yet approved or cleared by the [place] FDA and has been authorized for detection and/or diagnosis of SARS-CoV-2 by FDA under an Emergency Use Authorization (EUA).  This EUA will remain in effect (meaning this test can be used) for the duration of the COVID-19 declaration under Section 564(b)(1) of the Act, 21 U.S.C. section 360bbb-3(b)(1), unless the authorization is terminated or revoked sooner. Performed at [facility], [address] [place], [place] [address]   Type and screen [facility]     Status: None   Collection Time: 11/15/18  9:56 PM  Result Value Ref Range   ABO/RH(D) O POS    Antibody Screen NEG    Sample Expiration      11/18/2018,2359 Performed at [facility], [address] [place], [place] [address]   CBC     Status: Abnormal   Collection Time:  11/16/18  4:44 AM  Result Value Ref Range   WBC 8.8 4.0 - 10.5 K/uL   RBC 4.17 (L) 4.22 - 5.81 MIL/uL   Hemoglobin 13.1 13.0 - 17.0 g/dL   HCT 89.3 (L) 81.0 - 17.5 %   MCV 92.3 80.0 - 100.0 fL   MCH 31.4 26.0 - 34.0 pg   MCHC 34.0 30.0 - 36.0 g/dL   RDW 10.2 58.5 - 27.7 %   Platelets 311 150 - 400 K/uL   nRBC 0.0 0.0 - 0.2 %    Comment: Performed at [facility], [address] [place], [place] [address]  Basic metabolic panel     Status: Abnormal   Collection Time: 11/16/18  4:44 AM  Result Value Ref Range   Sodium 136 135 - 145 mmol/L   Potassium 4.3 3.5 - 5.1 mmol/L   Chloride 104 98 - 111 mmol/L   CO2 22 22 - 32 mmol/L   Glucose, Bld 158 (H) 70 - 99 mg/dL   BUN 14 6 - 20 mg/dL   Creatinine, Ser 5.36 0.61 - 1.24 mg/dL   Calcium 9.1 8.9 - 14.4 mg/dL   GFR calc non Af Amer >60 >60 mL/min   GFR calc Af Amer >60 >60 mL/min   Anion gap 10 5 - 15    Comment: Performed at [facility], [address], [place], [place] [address]  . Ct Soft Tissue Neck W Contrast  Result Date: 11/15/2018 CLINICAL DATA:  Right-sided jaw pain and swelling beginning 4 days ago. EXAM: CT NECK WITH CONTRAST TECHNIQUE: Multidetector CT imaging of the neck was performed using the standard protocol following the bolus administration of intravenous contrast. CONTRAST:  55mL OMNIPAQUE IOHEXOL 300 MG/ML  SOLN COMPARISON:  None. FINDINGS: Pharynx and larynx: No mucosal lesion is seen. There is marked swelling in the region of the right mandibular gingiva and the right sublingual space with a sublingual abscess measuring 3 cm in diameter. Salivary glands: Parotid and submandibular glands are normal. Thyroid: Normal Lymph nodes: Reactive nodal enlargement of the submandibular and level 2 nodes. No suppuration. Vascular: Normal Limited intracranial: Normal Visualized orbits: Normal Mastoids and visualized paranasal sinuses: Some opacification of the anterior ethmoid and frontal sinuses.  Not completely  imaged. Skeleton: Dental decay a and periodontal disease at teeth 1, 16 and 32. Disease at tooth 32 includes a fairly sizable periapical cyst and is probably the etiology of the right maxillary gingival and sublingual inflammatory disease. Upper chest: Negative Other: None IMPRESSION: Dental and periodontal disease teeth 1, 16 and 32. Periapical abscess of the right mandibular wisdom tooth 32 probably responsible for pronounced right face inflammatory change with right gingival and sublingual abscess measuring up to 3 cm in diameter. Reactive regional nodal enlargement without suppuration. Electronically Signed   By: Paulina Fusi M.D.   On: 11/15/2018 20:27  .   ASSESSMENT: Doing much better after transoral I&D of sublingual abscess. Trismus and swelling improved and white count resolved  PLAN: Continue Clindamycin and steroids and reasonable to discharge on Clindamycin PO as he continues to improve. Stressed need for dental follow-up to extract the decayed teeth.    Sandi Mealy, MD 11/16/2018 11:32 AM

## 2018-11-16 NOTE — Anesthesia Post-op Follow-up Note (Signed)
Anesthesia QCDR form completed.        

## 2018-11-16 NOTE — Op Note (Signed)
11/16/2018  12:07 AM    Trevor Tucker  [id number]   Pre-Op Diagnosis:  Sublingual space dental abscess  Post-op Diagnosis: Same  Procedure:   Transoral incision and drainage of sublingual space abscess  Surgeon:  [name]  Anesthesia:  General endotracheal  EBL:  less than 20 cc  Complications:  None  Findings: over 10cc of grossly purulent material drained transorally from sublingual abscess  Procedure: After the patient was identified in holding and the procedure was reviewed.  The patient was taken to the operating room and with the patient in a comfortable supine position,  general endotracheal anesthesia was induced without difficulty.  A proper time-out was performed, confirming the operative site and procedure.    During intubation the anesthesiologist noted purulence draining from the right side of the mouth posteriorly, so a bite block was placed and the oral cavity inspected. There was pus noted draining from a small opening adjacent to the gingiva in the right posterior floor of mouth near the infected molar. Dentition was noted to be very poor. With a definite draining tract identified, if was felt best to open this further to drain the abscess rather than the additional risks of transcervical drainage. A hemostat was used to probe the abscess cavity and bluntly spread the tract open, and a large amount of gross purulence drained from the cavity. The floor of mouth was probed until all pus was drained from the abscess and the floor of mouth more soft. An IV catheter was use to cannulate the tract, and the abscess cavity irrigated with sterile saline until clear. The throat was suctioned of all blood and purulence.  The patient was then returned to the anesthesiologist in good condition for awakening. The patient was awakened and taken to the recovery room in good condition.   Disposition:   PACU then admit to medicine for IV antibiotics  Plan: Admission for IV  antibiotics (Clindamycin) and steroids for swelling. Patient will need post discharge dental follow-up to address the infected tooth  [name] 11/16/2018 12:07 AM

## 2018-11-16 NOTE — Transfer of Care (Signed)
Immediate Anesthesia Transfer of Care Note  Patient: Trevor Tucker  Procedure(s) Performed: INCISION AND DRAINAGE Of Dental ABSCESS (N/A )  Patient Location: PACU  Anesthesia Type:General  Level of Consciousness: awake, alert  and oriented  Airway & Oxygen Therapy: Patient Spontanous Breathing and Patient connected to face mask oxygen  Post-op Assessment: Report given to RN and Post -op Vital signs reviewed and stable  Post vital signs: Reviewed and stable  Last Vitals:  Vitals Value Taken Time  BP    Temp    Pulse 92 11/16/18 0011  Resp 23 11/16/18 0011  SpO2 100 % 11/16/18 0011  Vitals shown include unvalidated device data.  Last Pain:  Vitals:   11/15/18 2121  TempSrc:   PainSc: 10-Worst pain ever         Complications: No apparent anesthesia complications

## 2018-11-16 NOTE — Progress Notes (Signed)
[facility] [place] at [facility]   PATIENT NAME: Trevor Tucker    MR#:  [id number]  DATE OF BIRTH:  January 29, 1991  SUBJECTIVE:   Patient states he is feeling fine this morning. He endorses some soreness of his tongue and mouth. He states he is still having trouble opening his mouth. Eating and drinking okay. No shortness of breath or throat swelling.  REVIEW OF SYSTEMS:  Review of Systems  Constitutional: Negative for chills and fever.  HENT: Positive for sore throat. Negative for congestion.   Eyes: Negative for blurred vision and double vision.  Respiratory: Negative for cough and shortness of breath.   Cardiovascular: Negative for chest pain and palpitations.  Gastrointestinal: Negative for nausea and vomiting.  Genitourinary: Negative for dysuria and urgency.  Musculoskeletal: Negative for back pain and neck pain.  Neurological: Negative for dizziness and headaches.  Psychiatric/Behavioral: Negative for depression. The patient is not nervous/anxious.     DRUG ALLERGIES:  No Known Allergies VITALS:  Blood pressure 129/70, pulse (!) 53, temperature 97.6 F (36.4 C), temperature source Oral, resp. rate 20, height 6\' 1"  (1.854 m), weight 69.4 kg, SpO2 99 %. PHYSICAL EXAMINATION:  Physical Exam  GENERAL:  Laying in the bed with no acute distress.  HEENT: Head atraumatic, normocephalic. Pupils equal, round, reactive to light and accommodation. No scleral icterus. Extraocular muscles intact. Oropharynx and nasopharynx clear. +trismus. +poor dentition. NECK:  Supple, no jugular venous distention. No thyroid enlargement. LUNGS: Lungs are clear to auscultation bilaterally. No wheezes, crackles, rhonchi. No use of accessory muscles of respiration. No inspiratory stridor. CARDIOVASCULAR: RRR, S1, S2 normal. No murmurs, rubs, or gallops.  ABDOMEN: Soft, nontender, nondistended. Bowel sounds present.  EXTREMITIES: No pedal edema, cyanosis, or clubbing.  NEUROLOGIC:  CN 2-12 intact, no focal deficits. 5/5 muscle strength throughout all extremities. Sensation intact throughout. Gait not checked.  PSYCHIATRIC: The patient is alert and oriented x 3.  SKIN: No obvious rash, lesion, or ulcer.  LABORATORY PANEL:  Male CBC Recent Labs  Lab 11/16/18 0444  WBC 8.8  HGB 13.1  HCT 38.5*  PLT 311   ------------------------------------------------------------------------------------------------------------------ Chemistries  Recent Labs  Lab 11/15/18 1904 11/16/18 0444  NA 136 136  K 4.0 4.3  CL 100 104  CO2 22 22  GLUCOSE 99 158*  BUN 14 14  CREATININE 0.96 0.88  CALCIUM 9.3 9.1  AST 14*  --   ALT 13  --   ALKPHOS 57  --   BILITOT 0.9  --    RADIOLOGY:  Ct Soft Tissue Neck W Contrast  Result Date: 11/15/2018 CLINICAL DATA:  Right-sided jaw pain and swelling beginning 4 days ago. EXAM: CT NECK WITH CONTRAST TECHNIQUE: Multidetector CT imaging of the neck was performed using the standard protocol following the bolus administration of intravenous contrast. CONTRAST:  57mL OMNIPAQUE IOHEXOL 300 MG/ML  SOLN COMPARISON:  None. FINDINGS: Pharynx and larynx: No mucosal lesion is seen. There is marked swelling in the region of the right mandibular gingiva and the right sublingual space with a sublingual abscess measuring 3 cm in diameter. Salivary glands: Parotid and submandibular glands are normal. Thyroid: Normal Lymph nodes: Reactive nodal enlargement of the submandibular and level 2 nodes. No suppuration. Vascular: Normal Limited intracranial: Normal Visualized orbits: Normal Mastoids and visualized paranasal sinuses: Some opacification of the anterior ethmoid and frontal sinuses. Not completely imaged. Skeleton: Dental decay a and periodontal disease at teeth 1, 16 and 32. Disease at tooth 32 includes a fairly sizable  periapical cyst and is probably the etiology of the right maxillary gingival and sublingual inflammatory disease. Upper chest: Negative Other:  None IMPRESSION: Dental and periodontal disease teeth 1, 16 and 32. Periapical abscess of the right mandibular wisdom tooth 32 probably responsible for pronounced right face inflammatory change with right gingival and sublingual abscess measuring up to 3 cm in diameter. Reactive regional nodal enlargement without suppuration. Electronically Signed   By: Nelson Chimes M.D.   On: 11/15/2018 20:27   ASSESSMENT AND PLAN:   Right sublingual abscess- secondary to dental infection. Leukocytosis has resolved. -s/p transoral I&D of sublingual space abscess overnight -Continue IV Clindamycin -Continue IV decardon -ENT following -Pain control -Patient will need to f/u with dentist on discharge -Will also need to f/u with ENT on discharge  Elevated blood pressure on admission- likely secondary to pain. BP normal this morning. -Will continue to monitor -IV PRN labetalol  Hyperglycemia- due to steroids -Monitor with daily labs  DVT prophylaxis- start lovenox this morning  Patient can likely be discharged home tomorrow.  All the records are reviewed and case discussed with Care Management/Social Worker. Management plans discussed with the patient, family and they are in agreement.  CODE STATUS: Full Code  TOTAL TIME TAKING CARE OF THIS PATIENT: 35 minutes.   More than 50% of the time was spent in counseling/coordination of care: YES  POSSIBLE D/C IN 1-2 DAYS, DEPENDING ON CLINICAL CONDITION.   Berna Spare Kandance Yano M.D on 11/16/2018 at 10:48 AM  Between 7am to 6pm - Pager - [phone]  After 6pm go to [email or web address] - Proofreader  [facility]  Office  [phone]  CC: Primary care physician; Patient, No Pcp Per  Note: This dictation was prepared with Dragon dictation along with smaller phrase technology. Any transcriptional errors that result from this process are unintentional.

## 2018-11-16 NOTE — OR Nursing (Signed)
Patient has been awake and talking, no complaints of pain tolerated ice chips well and now drinking water. Taking patient to room

## 2018-11-17 LAB — CBC
HCT: 36.5 % — ABNORMAL LOW (ref 39.0–52.0)
Hemoglobin: 12.3 g/dL — ABNORMAL LOW (ref 13.0–17.0)
MCH: 30.8 pg (ref 26.0–34.0)
MCHC: 33.7 g/dL (ref 30.0–36.0)
MCV: 91.3 fL (ref 80.0–100.0)
Platelets: 318 10*3/uL (ref 150–400)
RBC: 4 MIL/uL — ABNORMAL LOW (ref 4.22–5.81)
RDW: 11.9 % (ref 11.5–15.5)
WBC: 12 10*3/uL — ABNORMAL HIGH (ref 4.0–10.5)
nRBC: 0 % (ref 0.0–0.2)

## 2018-11-17 LAB — BASIC METABOLIC PANEL
Anion gap: 9 (ref 5–15)
BUN: 13 mg/dL (ref 6–20)
CO2: 26 mmol/L (ref 22–32)
Calcium: 9 mg/dL (ref 8.9–10.3)
Chloride: 106 mmol/L (ref 98–111)
Creatinine, Ser: 0.69 mg/dL (ref 0.61–1.24)
GFR calc Af Amer: >60 mL/min (ref >60)
GFR calc non Af Amer: >60 mL/min (ref >60)
Glucose, Bld: 127 mg/dL — ABNORMAL HIGH (ref 70–99)
Potassium: 4.5 mmol/L (ref 3.5–5.1)
Sodium: 141 mmol/L (ref 135–145)

## 2018-11-17 LAB — HIV ANTIBODY (ROUTINE TESTING W REFLEX): HIV Screen 4th Generation wRfx: NONREACTIVE

## 2018-11-17 MED ORDER — POLYETHYLENE GLYCOL 3350 17 G PO PACK
17.0000 g | PACK | Freq: Once | ORAL | Status: DC
  Filled 2018-11-17: qty 1

## 2018-11-17 MED ORDER — CLINDAMYCIN HCL 300 MG PO CAPS: 300.0000 mg | ORAL_CAPSULE | Freq: Four times a day (QID) | ORAL | 0 refills | Status: AC

## 2018-11-17 NOTE — Discharge Summary (Signed)
[facility] - [place] at [facility]   PATIENT NAME: Trevor Tucker    MR#:  [id number]  DATE OF BIRTH:  1990/04/24  DATE OF ADMISSION:  11/15/2018   ADMITTING PHYSICIAN: Jimmye Norman, NP  DATE OF DISCHARGE: 11/17/18  PRIMARY CARE PHYSICIAN: Patient, No Pcp Per   ADMISSION DIAGNOSIS:  Dental abscess [K04.7] Facial swelling [R22.0] DISCHARGE DIAGNOSIS:  Active Problems:   Sublingual abscess  SECONDARY DIAGNOSIS:  History reviewed. No pertinent past medical history. HOSPITAL COURSE:   [name] is a 28 year old male who presented to the ED with swelling and pain around his right submandibular area.  He then developed difficulty swallowing.  He was seen by his PCP, who prescribed some amoxicillin, but he had difficulty swallowing the medicine and he came to the ED for further management.  In the ED, CT neck showed a periapical abscess of the right mandibular wisdom tooth with a sub-lingual abscess and some reactive regional nodal enlargement.  He was admitted for further management.  Right sublingual abscess- secondary to dental infection.  -s/p transoral I&D of sublingual space abscess 9/28 -Treated with IV clindamycin and discharged home on p.o. clindamycin -Patient will need to f/u with dentist on discharge -Will also need to f/u with ENT on discharge  Elevated blood pressure on admission- likely secondary to pain. BP normal after his surgery. -Follow-up BP as an outpatient  Hyperglycemia- due to steroids -Follow-up with PCP  DISCHARGE CONDITIONS:  Right sublingual abscess S/P transoral I&D Elevated blood pressure Hyperglycemia CONSULTS OBTAINED:  ENT DRUG ALLERGIES:  No Known Allergies DISCHARGE MEDICATIONS:   Allergies as of 11/17/2018   No Known Allergies     Medication List    STOP taking these medications   amoxicillin 500 MG tablet Commonly known as: AMOXIL     TAKE these medications   clindamycin 300 MG capsule Commonly  known as: Cleocin Take 1 capsule (300 mg total) by mouth 4 (four) times daily.       DISCHARGE INSTRUCTIONS:  1.  Follow-up with PCP in 5 days 2.  Follow-up with ENT in 1 week 3.  Follow-up with a dentist ASAP 4.  Take clindamycin 4 times a day as prescribed DIET:  Regular diet DISCHARGE CONDITION:  Stable ACTIVITY:  Activity as tolerated OXYGEN:  Home Oxygen: No.  Oxygen Delivery: room air DISCHARGE LOCATION:  home   If you experience worsening of your admission symptoms, develop shortness of breath, life threatening emergency, suicidal or homicidal thoughts you must seek medical attention immediately by calling 911 or calling your MD immediately  if symptoms less severe.  You Must read complete instructions/literature along with all the possible adverse reactions/side effects for all the Medicines you take and that have been prescribed to you. Take any new Medicines after you have completely understood and accpet all the possible adverse reactions/side effects.   Please note  You were cared for by a hospitalist during your hospital stay. If you have any questions about your discharge medications or the care you received while you were in the hospital after you are discharged, you can call the unit and asked to speak with the hospitalist on call if the hospitalist that took care of you is not available. Once you are discharged, your primary care physician will handle any further medical issues. Please note that NO REFILLS for any discharge medications will be authorized once you are discharged, as it is imperative that you return to your primary care physician (or establish a  relationship with a primary care physician if you do not have one) for your aftercare needs so that they can reassess your need for medications and monitor your lab values.    On the day of Discharge:  VITAL SIGNS:  Blood pressure 131/72, pulse 72, temperature 98.1 F (36.7 C), temperature source Oral, resp.  rate 16, height 6\' 1"  (1.854 m), weight 69.4 kg, SpO2 99 %. PHYSICAL EXAMINATION:  GENERAL:  28 y.o.-year-old patient lying in the bed with no acute distress.  EYES: Pupils equal, round, reactive to light and accommodation. No scleral icterus. Extraocular muscles intact.  HEENT: Head atraumatic, normocephalic. Oropharynx and nasopharynx clear. + Poor dentition NECK:  Supple, no jugular venous distention. No thyroid enlargement, no tenderness. + Mild tenderness and edema of the right submandibular region LUNGS: Normal breath sounds bilaterally, no wheezing, rales,rhonchi or crepitation. No use of accessory muscles of respiration.  CARDIOVASCULAR: S1, S2 normal. No murmurs, rubs, or gallops.  ABDOMEN: Soft, non-tender, non-distended. Bowel sounds present. No organomegaly or mass.  EXTREMITIES: No pedal edema, cyanosis, or clubbing.  NEUROLOGIC: Cranial nerves II through XII are intact. Muscle strength 5/5 in all extremities. Sensation intact. Gait not checked.  PSYCHIATRIC: The patient is alert and oriented x 3.  SKIN: No obvious rash, lesion, or ulcer.  DATA REVIEW:   CBC Recent Labs  Lab 11/17/18 0404  WBC 12.0*  HGB 12.3*  HCT 36.5*  PLT 318    Chemistries  Recent Labs  Lab 11/15/18 1904  11/17/18 0404  NA 136   < > 141  K 4.0   < > 4.5  CL 100   < > 106  CO2 22   < > 26  GLUCOSE 99   < > 127*  BUN 14   < > 13  CREATININE 0.96   < > 0.69  CALCIUM 9.3   < > 9.0  AST 14*  --   --   ALT 13  --   --   ALKPHOS 57  --   --   BILITOT 0.9  --   --    < > = values in this interval not displayed.     Microbiology Results  Results for orders placed or performed during the hospital encounter of 11/15/18  SARS Coronavirus 2 [facility] order, Performed in [facility] hospital lab) Nasopharyngeal Nasopharyngeal Swab     Status: None   Collection Time: 11/15/18  9:56 PM   Specimen: Nasopharyngeal Swab  Result Value Ref Range Status   SARS Coronavirus 2 NEGATIVE NEGATIVE Final     Comment: (NOTE) If result is NEGATIVE SARS-CoV-2 target nucleic acids are NOT DETECTED. The SARS-CoV-2 RNA is generally detectable in upper and lower  respiratory specimens during the acute phase of infection. The lowest  concentration of SARS-CoV-2 viral copies this assay can detect is 250  copies / mL. A negative result does not preclude SARS-CoV-2 infection  and should not be used as the sole basis for treatment or other  patient management decisions.  A negative result may occur with  improper specimen collection / handling, submission of specimen other  than nasopharyngeal swab, presence of viral mutation(s) within the  areas targeted by this assay, and inadequate number of viral copies  (<250 copies / mL). A negative result must be combined with clinical  observations, patient history, and epidemiological information. If result is POSITIVE SARS-CoV-2 target nucleic acids are DETECTED. The SARS-CoV-2 RNA is generally detectable in upper and lower  respiratory specimens dur  ing the acute phase of infection.  Positive  results are indicative of active infection with SARS-CoV-2.  Clinical  correlation with patient history and other diagnostic information is  necessary to determine patient infection status.  Positive results do  not rule out bacterial infection or co-infection with other viruses. If result is PRESUMPTIVE POSTIVE SARS-CoV-2 nucleic acids MAY BE PRESENT.   A presumptive positive result was obtained on the submitted specimen  and confirmed on repeat testing.  While 2019 novel coronavirus  (SARS-CoV-2) nucleic acids may be present in the submitted sample  additional confirmatory testing may be necessary for epidemiological  and / or clinical management purposes  to differentiate between  SARS-CoV-2 and other Sarbecovirus currently known to infect humans.  If clinically indicated additional testing with an alternate test  methodology [phone]([id number]) is advised. The SARS-CoV-2  RNA is generally  detectable in upper and lower respiratory sp ecimens during the acute  phase of infection. The expected result is Negative. Fact Sheet for Patients:  BoilerBrush.com.cyhttps://www.fda.gov/media/136312/download Fact Sheet for Healthcare Providers: https://pope.com/https://www.fda.gov/media/136313/download This test is not yet approved or cleared by the [place] FDA and has been authorized for detection and/or diagnosis of SARS-CoV-2 by FDA under an Emergency Use Authorization (EUA).  This EUA will remain in effect (meaning this test can be used) for the duration of the COVID-19 declaration under Section 564(b)(1) of the Act, 21 U.S.C. section 360bbb-3(b)(1), unless the authorization is terminated or revoked sooner. Performed at [facility], [address] [place], [place] [address]     RADIOLOGY:  No results found.   Management plans discussed with the patient, family and they are in agreement.  CODE STATUS: Full Code   TOTAL TIME TAKING CARE OF THIS PATIENT: 45 minutes.    Jinny BlossomKaty D Jaunice Mirza M.D on 11/17/2018 at 10:17 AM  Between 7am to 6pm - Pager - [phone]  After 6pm go to [email or web address] - Social research officer, governmentpassword EPAS [facility]  [facility] [place] [facility]  Office  [phone]  CC: Primary care physician; Patient, No Pcp Per   Note: This dictation was prepared with Dragon dictation along with smaller phrase technology. Any transcriptional errors that result from this process are unintentional.

## 2018-11-17 NOTE — Progress Notes (Deleted)
Temiloluwa M Chevez  A and O x 4 VSS. Pt tolerating diet well. No complaints of pain or nausea. IV removed intact, prescriptions given. Pt voices understanding of discharge instructions with no further questions. Pt discharged via wheelchair with axillary.   Allergies as of 11/17/2018   No Known Allergies     Medication List    STOP taking these medications   amoxicillin 500 MG tablet Commonly known as: AMOXIL     TAKE these medications   clindamycin 300 MG capsule Commonly known as: Cleocin Take 1 capsule (300 mg total) by mouth 4 (four) times daily.       Vitals:   11/16/18 2027 11/17/18 0554  BP: 137/65 131/72  Pulse: 71 72  Resp: 16 16  Temp: 98.5 F (36.9 C) 98.1 F (36.7 C)  SpO2: 97% 99%    Lafawn Lenoir  

## 2018-11-17 NOTE — Progress Notes (Signed)
Trevor Tucker  A and O x 4 VSS. Pt tolerating diet well. No complaints of pain or nausea. IV removed intact, prescriptions given. Pt voices understanding of discharge instructions with no further questions. Pt discharged via wheelchair with axillary.   Allergies as of 11/17/2018   No Known Allergies     Medication List    STOP taking these medications   amoxicillin 500 MG tablet Commonly known as: AMOXIL     TAKE these medications   clindamycin 300 MG capsule Commonly known as: Cleocin Take 1 capsule (300 mg total) by mouth 4 (four) times daily.       Vitals:   11/16/18 2027 11/17/18 0554  BP: 137/65 131/72  Pulse: 71 72  Resp: 16 16  Temp: 98.5 F (36.9 C) 98.1 F (36.7 C)  SpO2: 97% 99%    Trevor Tucker

## 2018-11-17 NOTE — Discharge Instructions (Signed)
It was so nice to meet you during this hospitalization!  You came into the hospital with an abscess. You had it drained by Dr. Richardson Landry. I have ordered an antibiotic called Clindamycin to go home with. You will need to take this 4 times a day. Please make sure you take 2 doses today and then you will continue for 8 more days.  Please make sure you see Dr. Richardson Landry in clinic for your follow-up appointment.  Take care, Dr. Brett Albino    Also, follow up with a dentist ASAP! You will need to make this appointment on your own, but it is absolutely necessary.

## 2019-02-17 ENCOUNTER — Ambulatory Visit: Payer: BC Managed Care – PPO | Attending: Internal Medicine

## 2019-02-17 DIAGNOSIS — Z20822 Contact with and (suspected) exposure to covid-19: Secondary | ICD-10-CM

## 2019-02-18 LAB — NOVEL CORONAVIRUS, NAA: SARS-CoV-2, NAA: NOT DETECTED

## 2019-06-20 ENCOUNTER — Emergency Department
Admission: EM | Admit: 2019-06-20 | Discharge: 2019-06-20 | Disposition: A | Discharge status: Home / Self Care | Payer: Self-pay | Attending: Emergency Medicine | Admitting: Emergency Medicine

## 2019-06-20 ENCOUNTER — Emergency Department: Payer: Self-pay

## 2019-06-20 ENCOUNTER — Other Ambulatory Visit: Payer: Self-pay

## 2019-06-20 DIAGNOSIS — T148XXA Other injury of unspecified body region, initial encounter: Secondary | ICD-10-CM

## 2019-06-20 DIAGNOSIS — Y929 Unspecified place or not applicable: Secondary | ICD-10-CM | POA: Insufficient documentation

## 2019-06-20 DIAGNOSIS — F17210 Nicotine dependence, cigarettes, uncomplicated: Secondary | ICD-10-CM | POA: Insufficient documentation

## 2019-06-20 DIAGNOSIS — Y9389 Activity, other specified: Secondary | ICD-10-CM | POA: Insufficient documentation

## 2019-06-20 DIAGNOSIS — W450XXA Nail entering through skin, initial encounter: Secondary | ICD-10-CM | POA: Insufficient documentation

## 2019-06-20 DIAGNOSIS — S91331A Puncture wound without foreign body, right foot, initial encounter: Secondary | ICD-10-CM | POA: Insufficient documentation

## 2019-06-20 DIAGNOSIS — S91332A Puncture wound without foreign body, left foot, initial encounter: Secondary | ICD-10-CM | POA: Insufficient documentation

## 2019-06-20 DIAGNOSIS — Z23 Encounter for immunization: Secondary | ICD-10-CM | POA: Insufficient documentation

## 2019-06-20 DIAGNOSIS — Y999 Unspecified external cause status: Secondary | ICD-10-CM | POA: Insufficient documentation

## 2019-06-20 MED ORDER — AMOXICILLIN-POT CLAVULANATE 875-125 MG PO TABS
1.0000 | ORAL_TABLET | Freq: Once | ORAL | Status: AC
  Administered 2019-06-20: 22:00:00 1 via ORAL
  Filled 2019-06-20: qty 1

## 2019-06-20 MED ORDER — MELOXICAM 15 MG PO TABS: 15.0000 mg | ORAL_TABLET | Freq: Every day | ORAL | 0 refills | Status: AC

## 2019-06-20 MED ORDER — AMOXICILLIN-POT CLAVULANATE 875-125 MG PO TABS: 1.0000 | ORAL_TABLET | Freq: Two times a day (BID) | ORAL | 0 refills | Status: AC

## 2019-06-20 MED ORDER — TETANUS-DIPHTH-ACELL PERTUSSIS 5-2.5-18.5 LF-MCG/0.5 IM SUSP
0.5000 mL | Freq: Once | INTRAMUSCULAR | Status: AC
  Administered 2019-06-20: 0.5 mL via INTRAMUSCULAR
  Filled 2019-06-20: qty 0.5

## 2019-06-20 MED ORDER — HYDROCODONE-ACETAMINOPHEN 5-325 MG PO TABS: 1.0000 | ORAL_TABLET | ORAL | 0 refills | Status: AC | PRN

## 2019-06-20 MED ORDER — OXYCODONE-ACETAMINOPHEN 5-325 MG PO TABS
1.0000 | ORAL_TABLET | Freq: Once | ORAL | Status: AC
  Administered 2019-06-20: 1 via ORAL
  Filled 2019-06-20: qty 1

## 2019-06-20 NOTE — ED Provider Notes (Signed)
[facility] Emergency Department Provider Note  ____________________________________________  Time seen: Approximately 10:00 PM  I have reviewed the triage vital signs and the nursing notes.   HISTORY  Chief Complaint Foot Pain    HPI Trevor Tucker is a 29 y.o. male who presents the emergency department after stepping on exposed rusty nails of both feet.  Patient was helping a friend redo a deck when he accidentally stepped on exposed nails.  Patient unfortunately landed on these nails of both feet.  They did not penetrate from the plantar aspect of the dorsal aspect of the foot.  Unsure of last tetanus shot.  Patient reports that injury occurred approximately 4 hours ago.  Initially they hurt slightly but he became concerned as there was swelling to both feet.  No gross erythema.  No ongoing bleeding from the wounds.  Patient with no medications prior to arrival.         History reviewed. No pertinent past medical history.  Patient Active Problem List   Diagnosis Date Noted  . Sublingual abscess 11/15/2018    Past Surgical History:  Procedure Laterality Date  . INCISION AND DRAINAGE ABSCESS N/A 11/15/2018   Procedure: INCISION AND DRAINAGE Of Dental ABSCESS;  Surgeon: Clyde Canterbury, MD;  Location: [facility] ORS;  Service: ENT;  Laterality: N/A;    Prior to Admission medications   Medication Sig Start Date End Date Taking? Authorizing Provider  amoxicillin-clavulanate (AUGMENTIN) 875-125 MG tablet Take 1 tablet by mouth 2 (two) times daily. 06/20/19   Clotile Whittington, Charline Bills, PA-C  clindamycin (CLEOCIN) 300 MG capsule Take 1 capsule (300 mg total) by mouth 4 (four) times daily. 11/17/18   Mayo, Pete Pelt, MD  HYDROcodone-acetaminophen (NORCO/VICODIN) 5-325 MG tablet Take 1 tablet by mouth every 4 (four) hours as needed for moderate pain. 06/20/19   Ilma Achee, Charline Bills, PA-C  meloxicam (MOBIC) 15 MG tablet Take 1 tablet (15 mg total) by mouth daily. 06/20/19    Misty Foutz, Charline Bills, PA-C    Allergies Patient has no known allergies.  No family history on file.  Social History Social History   Tobacco Use  . Smoking status: Current Some Day Smoker    Types: Cigarettes  . Smokeless tobacco: Current User    Types: Snuff, Chew  Substance Use Topics  . Alcohol use: Not on file  . Drug use: Not on file     Review of Systems  Constitutional: No fever/chills Eyes: No visual changes. No discharge ENT: No upper respiratory complaints. Cardiovascular: no chest pain. Respiratory: no cough. No SOB. Gastrointestinal: No abdominal pain.  No nausea, no vomiting.  No diarrhea.  No constipation. Musculoskeletal: Injury to bilateral plantar feet after stepping on an exposed rusty nails Skin: Negative for rash, abrasions, lacerations, ecchymosis. Neurological: Negative for headaches, focal weakness or numbness. 10-point ROS otherwise negative.  ____________________________________________   PHYSICAL EXAM:  VITAL SIGNS: ED Triage Vitals  Enc Vitals Group     BP      Pulse      Resp      Temp      Temp src      SpO2      Weight      Height      Head Circumference      Peak Flow      Pain Score      Pain Loc      Pain Edu?      Excl. in [place]?      Constitutional:  Alert and oriented. Well appearing and in no acute distress. Eyes: Conjunctivae are normal. PERRL. EOMI. Head: Atraumatic. ENT:      Ears:       Nose: No congestion/rhinnorhea.      Mouth/Throat: Mucous membranes are moist.  Neck: No stridor.    Cardiovascular: Normal rate, regular rhythm. Normal S1 and S2.  Good peripheral circulation. Respiratory: Normal respiratory effort without tachypnea or retractions. Lungs CTAB. Good air entry to the bases with no decreased or absent breath sounds. Musculoskeletal: Full range of motion to all extremities. No gross deformities appreciated.  Visualization of bilateral feet reveals 2 puncture wounds to the plantar aspect of the  right foot, 1 to the left.  No visualized foreign body to any of the wounds.  No surrounding erythema.  No purulent drainage.  No bloody drainage.  Areas are very tender to palpation with no palpable underlying abnormality consistent with foreign body.  Good sensation intact all digits.  Capillary refill less than 2 seconds all digits.  Mild edema of both feet consistent with soft tissue injuries.  No streaking. Neurologic:  Normal speech and language. No gross focal neurologic deficits are appreciated.  Skin:  Skin is warm, dry and intact. No rash noted. Psychiatric: Mood and affect are normal. Speech and behavior are normal. Patient exhibits appropriate insight and judgement.   ____________________________________________   LABS (all labs ordered are listed, but only abnormal results are displayed)  Labs Reviewed - No data to display ____________________________________________  EKG   ____________________________________________  RADIOLOGY I personally viewed and evaluated these images as part of my medical decision making, as well as reviewing the written report by the radiologist.  DG Foot Complete Left  Result Date: 06/20/2019 CLINICAL DATA:  Stepped on a nail EXAM: LEFT FOOT - COMPLETE 3+ VIEW COMPARISON:  None. FINDINGS: There is no evidence of fracture or dislocation. There is no evidence of arthropathy or other focal bone abnormality. Soft tissues are unremarkable. IMPRESSION: Negative. Electronically Signed   By: Katherine Mantle M.D.   On: 06/20/2019 22:20   DG Foot Complete Right  Result Date: 06/20/2019 CLINICAL DATA:  Stepped on a nail. EXAM: RIGHT FOOT COMPLETE - 3+ VIEW COMPARISON:  None. FINDINGS: There is no evidence of fracture or dislocation. There is no evidence of arthropathy or other focal bone abnormality. Soft tissues are unremarkable. IMPRESSION: Negative. Electronically Signed   By: Katherine Mantle M.D.   On: 06/20/2019 22:21     ____________________________________________    PROCEDURES  Procedure(s) performed:    Procedures    Medications  oxyCODONE-acetaminophen (PERCOCET/ROXICET) 5-325 MG per tablet 1 tablet (has no administration in time range)  amoxicillin-clavulanate (AUGMENTIN) 875-125 MG per tablet 1 tablet (1 tablet Oral Given 06/20/19 2219)  Tdap (BOOSTRIX) injection 0.5 mL (0.5 mLs Intramuscular Given 06/20/19 2220)     ____________________________________________   INITIAL IMPRESSION / ASSESSMENT AND PLAN / ED COURSE  Pertinent labs & imaging results that were available during my care of the patient were reviewed by me and considered in my medical decision making (see chart for details).  Review of the  [place] was performed in accordance of the [facility] prior to dispensing any controlled drugs.           Patient's diagnosis is consistent with puncture wounds to both feet.  Patient presented to the emergency department with puncture wounds to both feet after stepping on exposed nails while helping a friend fix a deck.  Patient had 3 puncture wounds identified, 2  to the right foot, 1 to the left.  No visualized foreign body but given the fact that this went through his shoe imaging was obtained.  No osseous or radiopaque foreign bodies identified.  Areas were thoroughly cleansed and covered using Band-Aids.  Wound care instructions discussed with the patient.  Patient's tetanus shot was updated today..  Follow-up with primary care or podiatry if needed.  Patient will be prescribed augmentin, meloxicam, norco.  patient is given ED precautions to return to the ED for any worsening or new symptoms.     ____________________________________________  FINAL CLINICAL IMPRESSION(S) / ED DIAGNOSES  Final diagnoses:  Puncture wound      NEW MEDICATIONS STARTED DURING THIS VISIT:  ED Discharge Orders         Ordered    amoxicillin-clavulanate (AUGMENTIN) 875-125 MG tablet  2 times daily      06/20/19 2244    HYDROcodone-acetaminophen (NORCO/VICODIN) 5-325 MG tablet  Every 4 hours PRN     06/20/19 2244    meloxicam (MOBIC) 15 MG tablet  Daily     06/20/19 2244              This chart was dictated using voice recognition software/Dragon. Despite best efforts to proofread, errors can occur which can change the meaning. Any change was purely unintentional.    Racheal Patches, PA-C 06/20/19 2244    Sharman Cheek, MD 06/21/19 [phone]

## 2019-06-20 NOTE — ED Triage Notes (Signed)
Pt arrives via POV after stepping on nails- pt states he stepped on 2 in the right foot and one in the left foot- pt states he was working on a deck when this happened

## 2022-05-01 IMAGING — DX DG FOOT COMPLETE 3+V*R*
3 series · 3 of 3 positions shown · non-contrast
Comparison: None.

CLINICAL DATA: Stepped on a nail.

EXAM:
RIGHT FOOT COMPLETE - 3+ VIEW

[foot ap]
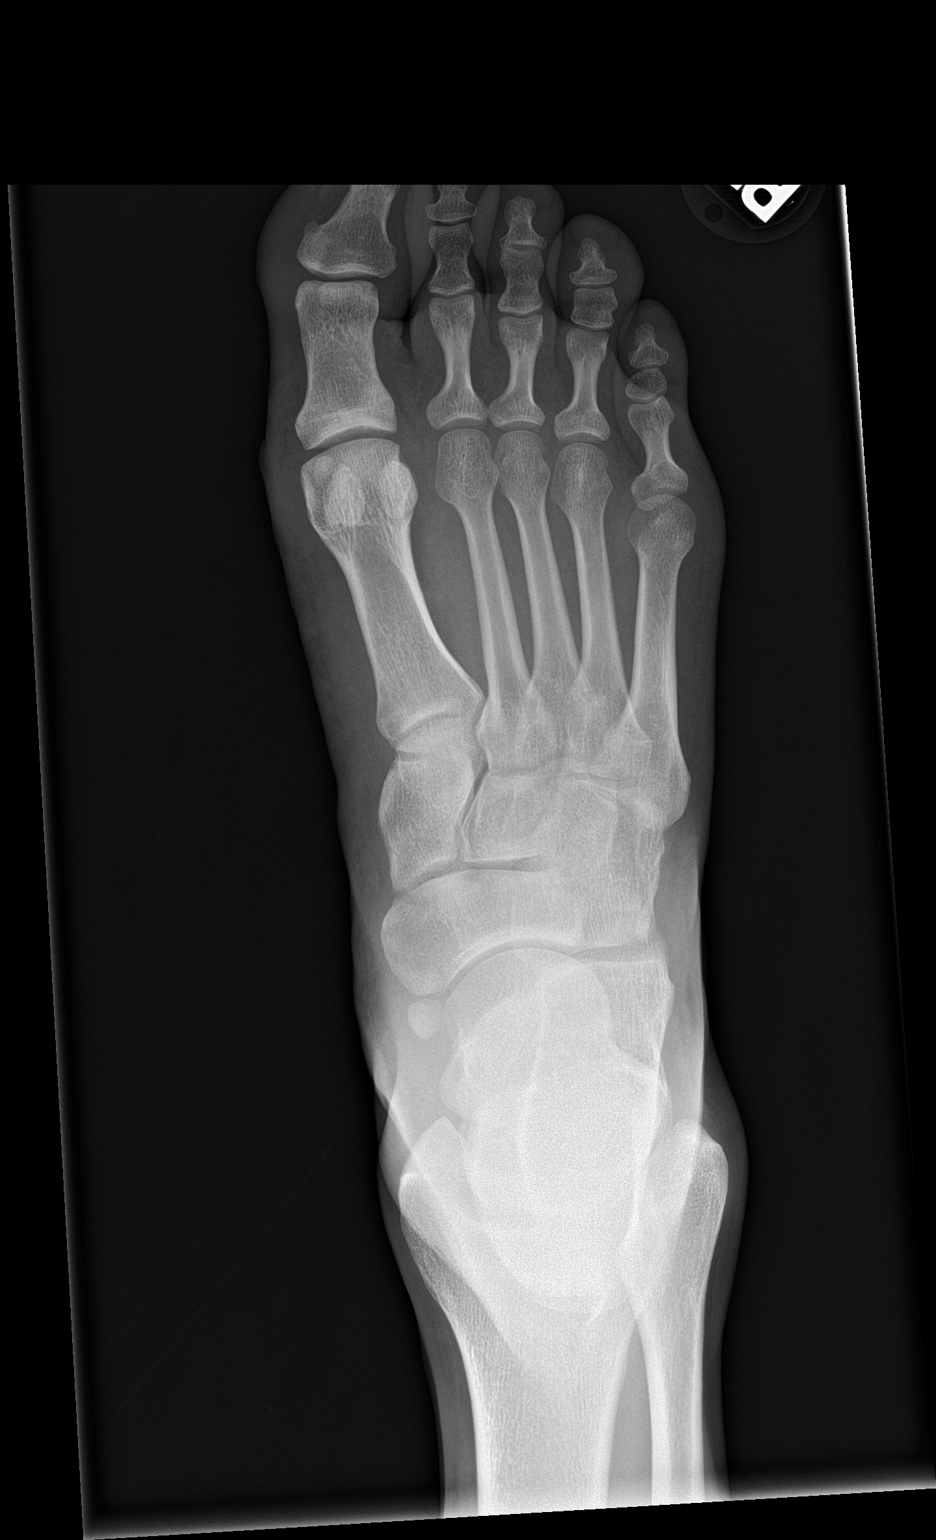

[foot obl]
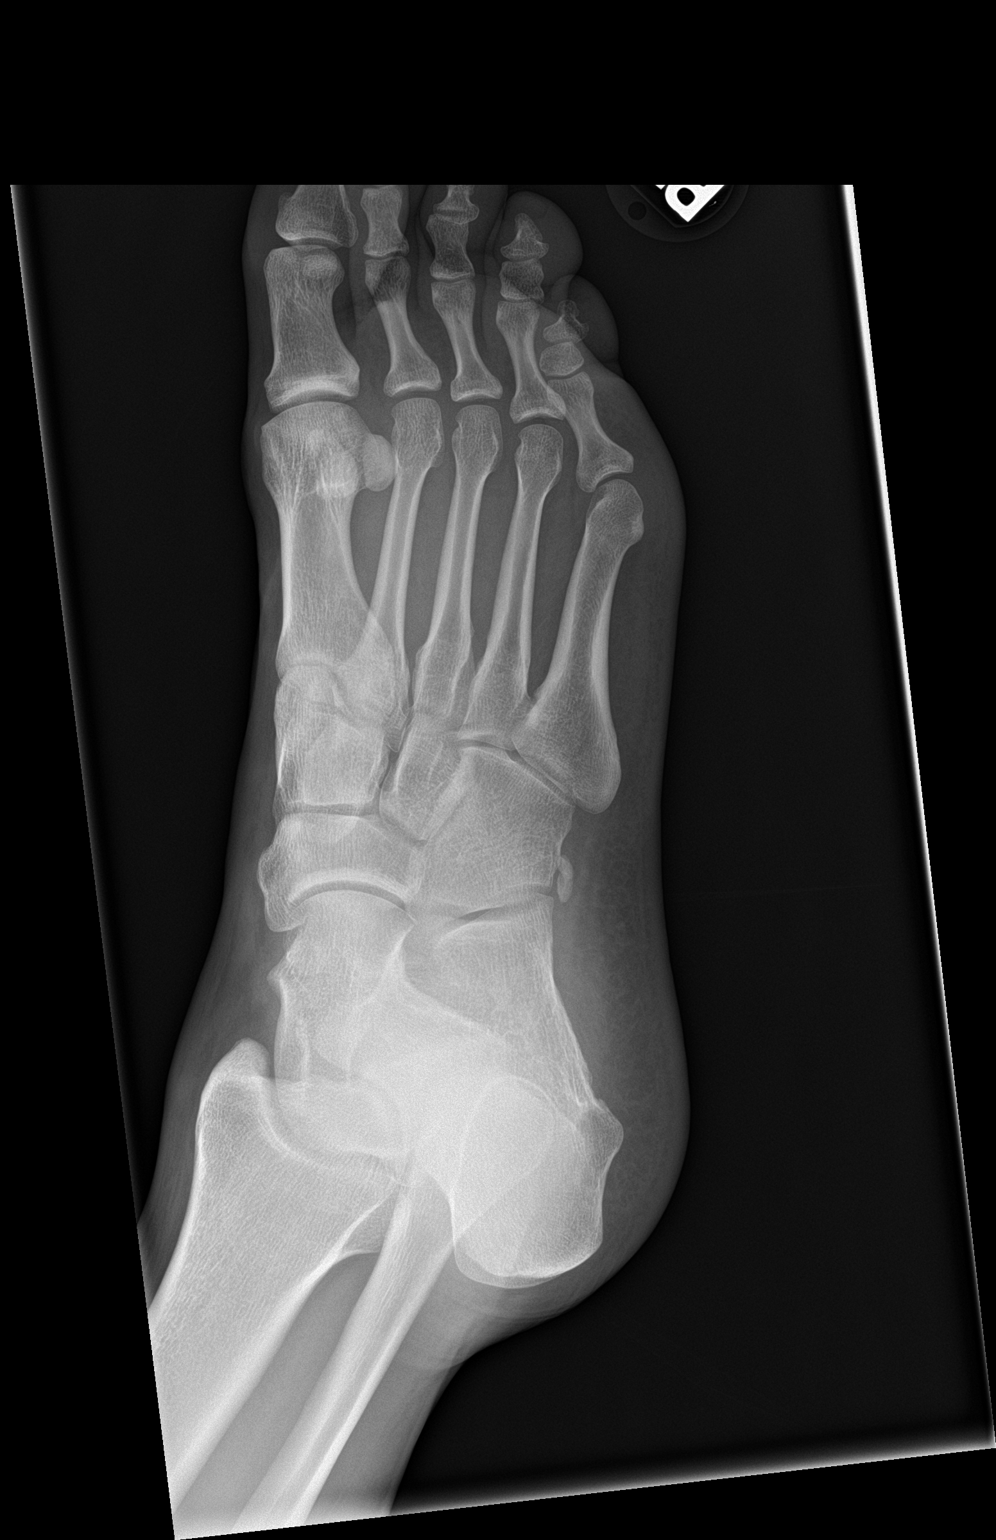

[foot lat]
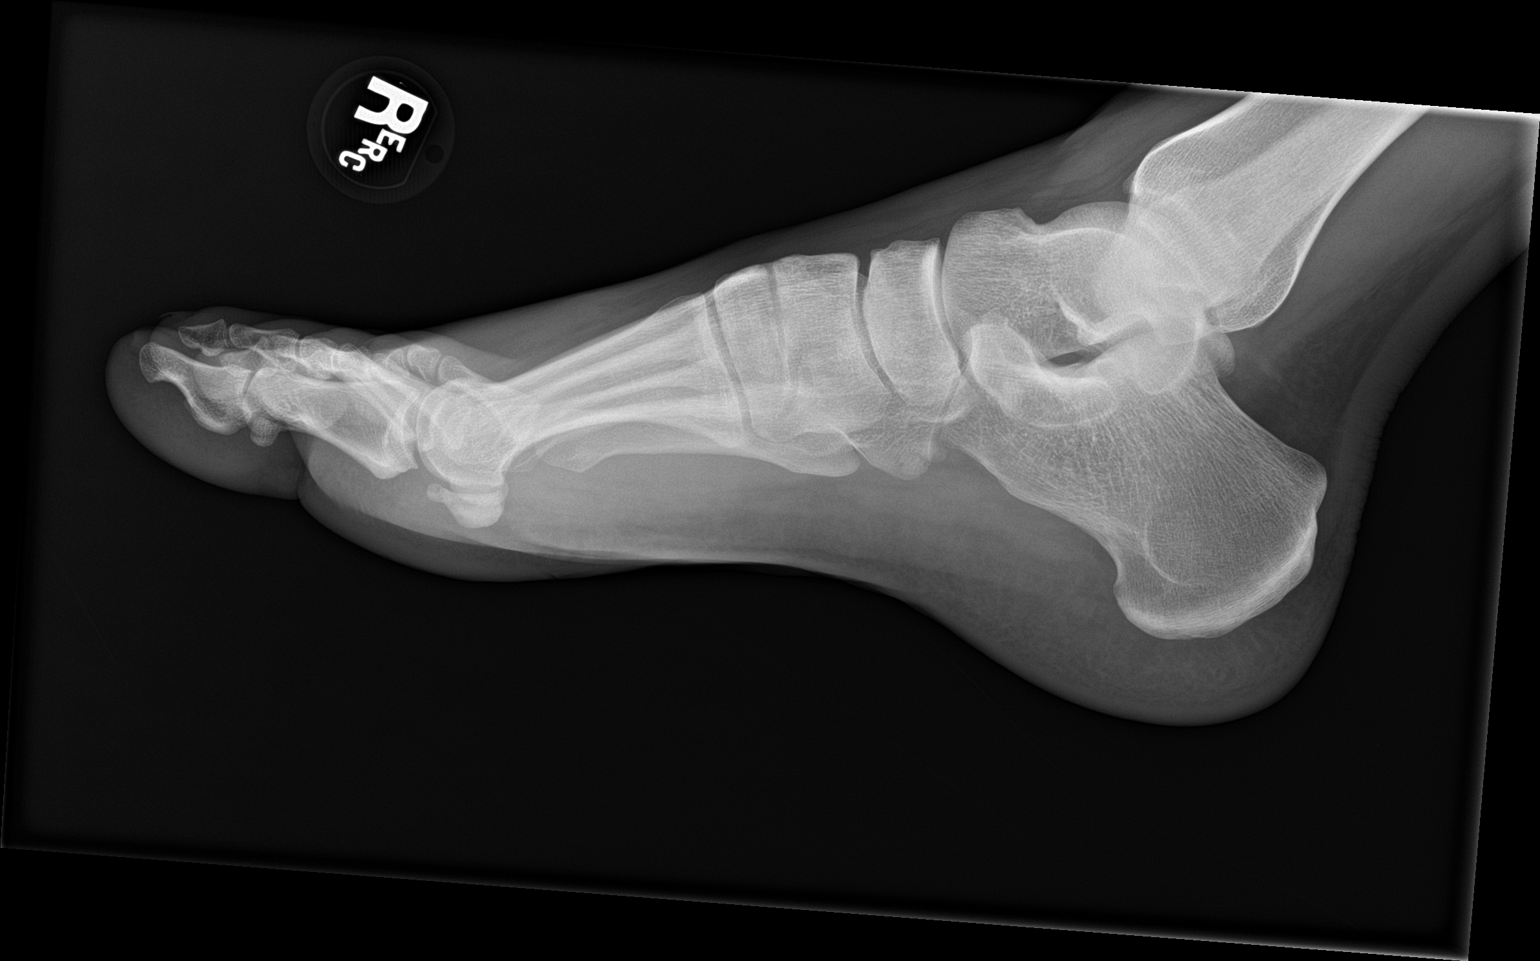

[3 of 3 positions shown; findings below may reference images not displayed]

FINDINGS: There is no evidence of fracture or dislocation. There is no
evidence of arthropathy or other focal bone abnormality. Soft
tissues are unremarkable.
IMPRESSION: Negative.

## 2022-05-01 IMAGING — DX DG FOOT COMPLETE 3+V*L*
3 series · 3 of 3 positions shown · non-contrast
Comparison: None.

CLINICAL DATA: Stepped on a nail

EXAM:
LEFT FOOT - COMPLETE 3+ VIEW

[foot ap]
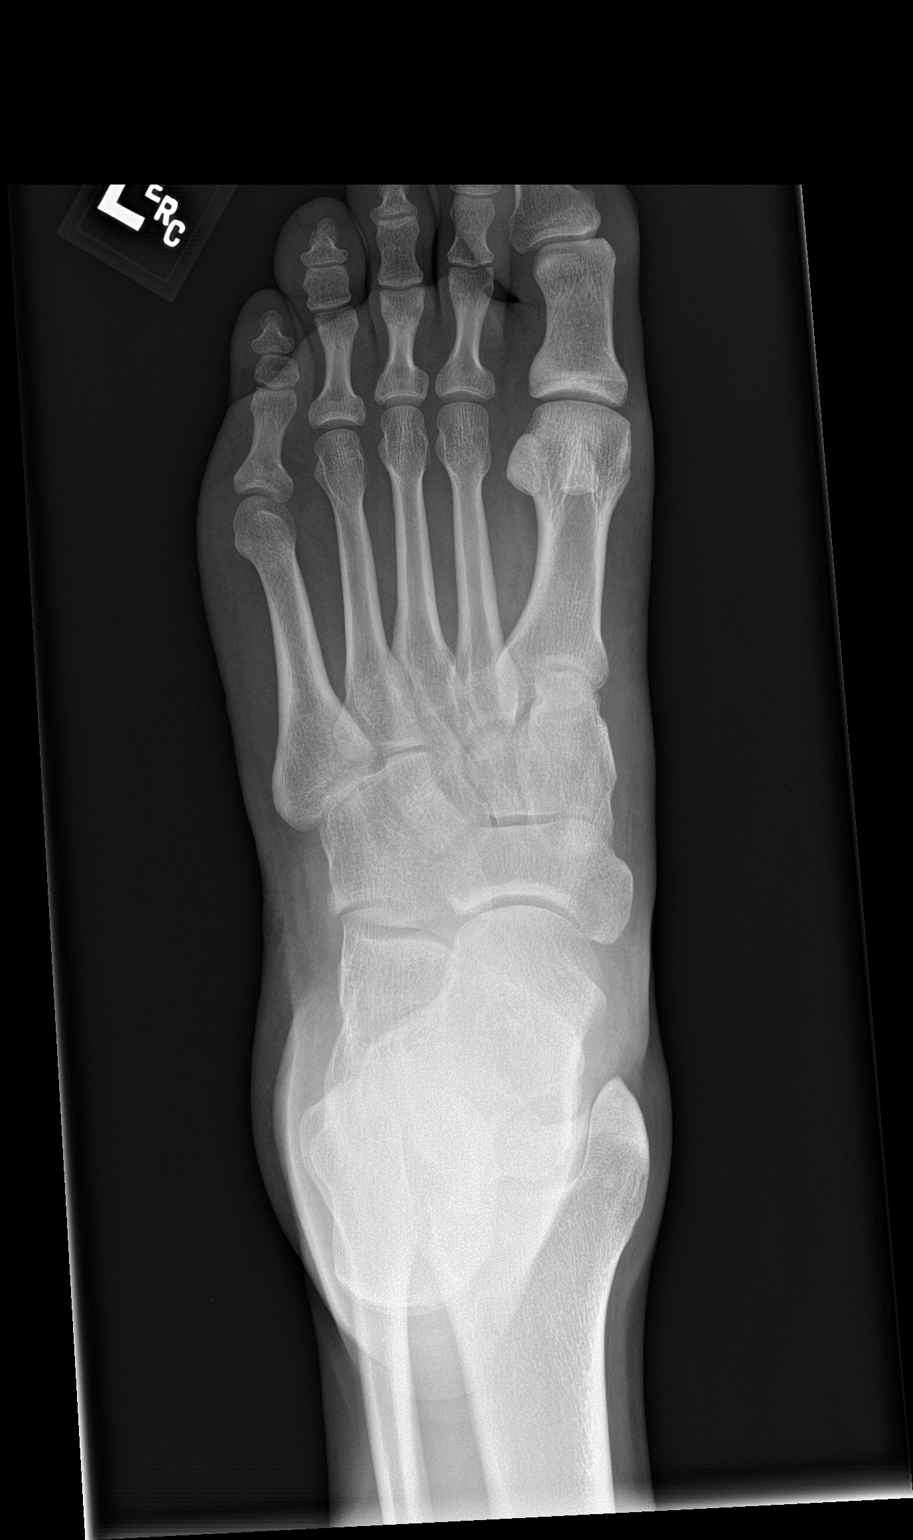

[foot obl]
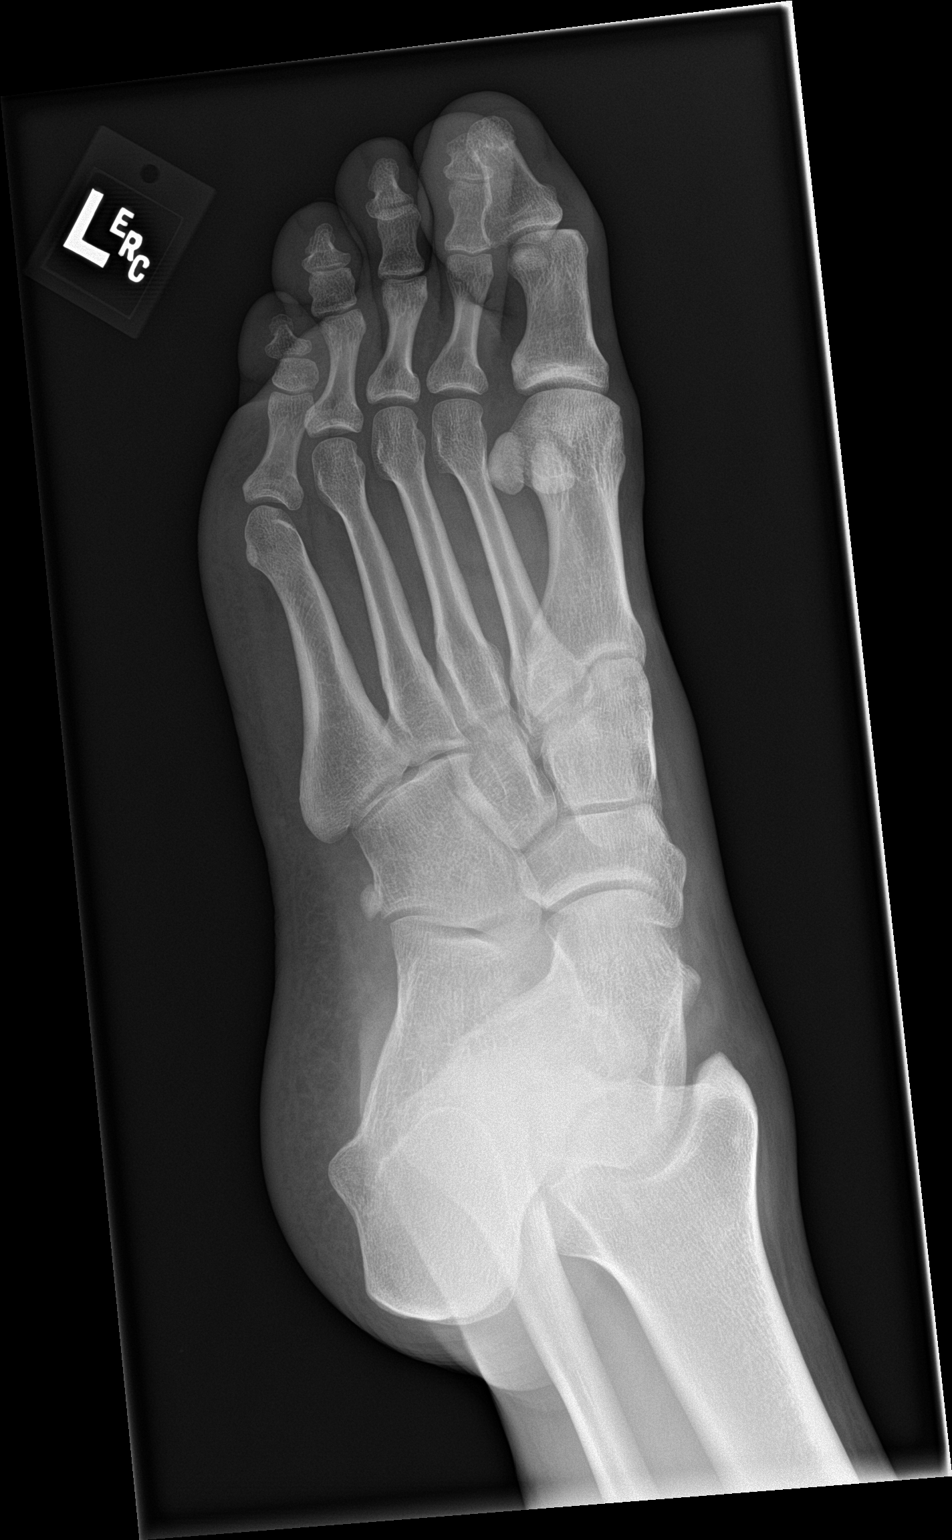

[foot lat]
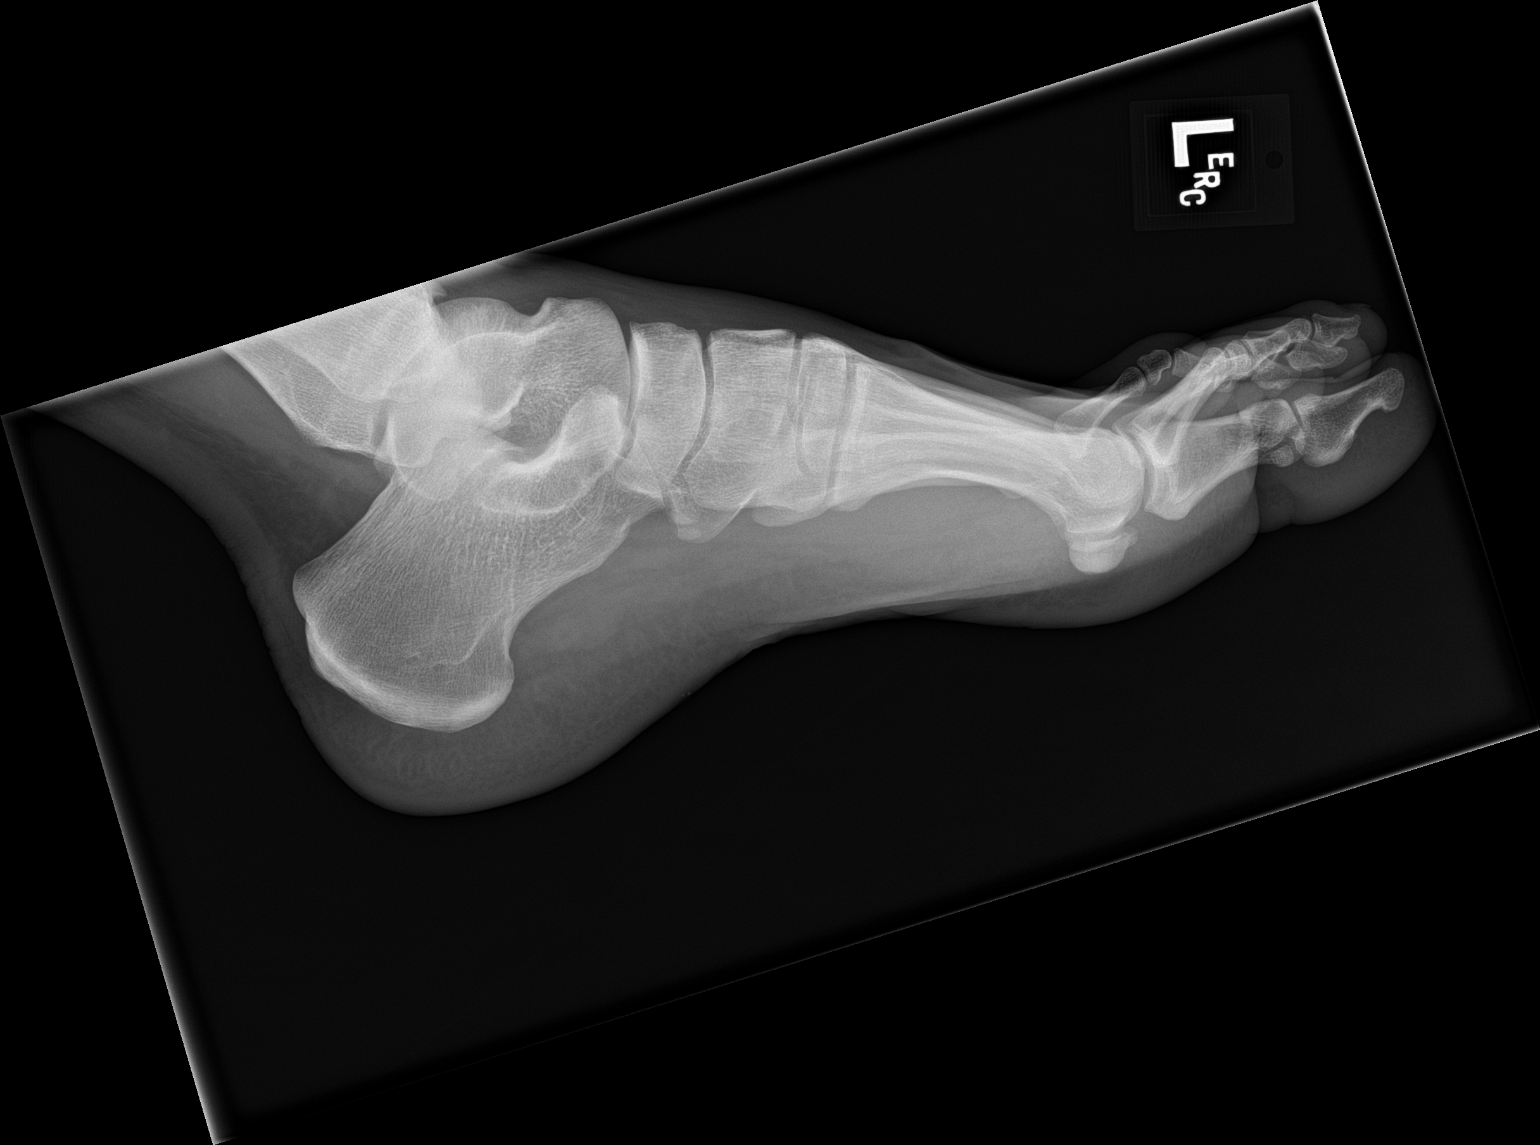

[3 of 3 positions shown; findings below may reference images not displayed]

FINDINGS: There is no evidence of fracture or dislocation. There is no
evidence of arthropathy or other focal bone abnormality. Soft
tissues are unremarkable.
IMPRESSION: Negative.
